# Patient Record
Sex: Female | Born: 1985 | Race: Black or African American | Hispanic: No | Marital: Single | Smoking: Never smoker
Health system: Southern US, Community
[De-identification: ages and names within clinical notes are randomized; demographics above are authoritative.]

## PROBLEM LIST (undated history)

## (undated) DIAGNOSIS — N920 Excessive and frequent menstruation with regular cycle: Secondary | ICD-10-CM

## (undated) DIAGNOSIS — T7840XA Allergy, unspecified, initial encounter: Secondary | ICD-10-CM

## (undated) DIAGNOSIS — D649 Anemia, unspecified: Secondary | ICD-10-CM

## (undated) DIAGNOSIS — D259 Leiomyoma of uterus, unspecified: Secondary | ICD-10-CM

## (undated) DIAGNOSIS — R0600 Dyspnea, unspecified: Secondary | ICD-10-CM

## (undated) HISTORY — DX: Allergy, unspecified, initial encounter: T78.40XA

## (undated) HISTORY — PX: TUBAL LIGATION: SHX77

---

## 2006-05-16 ENCOUNTER — Other Ambulatory Visit: Payer: Self-pay

## 2006-05-16 ENCOUNTER — Emergency Department: Payer: Self-pay | Admitting: Internal Medicine

## 2007-10-03 ENCOUNTER — Observation Stay: Payer: Self-pay

## 2010-07-06 ENCOUNTER — Ambulatory Visit: Payer: Self-pay | Admitting: Internal Medicine

## 2010-08-22 LAB — HM PAP SMEAR: HM Pap smear: NORMAL

## 2010-12-24 ENCOUNTER — Ambulatory Visit: Payer: Self-pay | Admitting: Internal Medicine

## 2011-03-17 ENCOUNTER — Emergency Department: Payer: Self-pay | Admitting: *Deleted

## 2011-04-25 LAB — HM HIV SCREENING LAB: HM HIV Screening: NEGATIVE

## 2011-05-27 HISTORY — PX: TUBAL LIGATION: SHX77

## 2011-08-18 ENCOUNTER — Observation Stay: Payer: Self-pay

## 2011-08-18 LAB — URINALYSIS, COMPLETE
Bilirubin,UR: NEGATIVE
Blood: NEGATIVE
Glucose,UR: NEGATIVE mg/dL (ref 0–75)
Hyaline Cast: 1
Leukocyte Esterase: NEGATIVE
Nitrite: NEGATIVE
Ph: 6 (ref 4.5–8.0)
Protein: 30
RBC,UR: 1 /HPF (ref 0–5)
Specific Gravity: 1.024 (ref 1.003–1.030)
WBC UR: 7 /HPF (ref 0–5)

## 2011-08-18 LAB — URIC ACID: Uric Acid: 3.3 mg/dL (ref 2.6–6.0)

## 2011-08-18 LAB — COMPREHENSIVE METABOLIC PANEL
Albumin: 2.5 g/dL — ABNORMAL LOW (ref 3.4–5.0)
Anion Gap: 12 (ref 7–16)
BUN: 7 mg/dL (ref 7–18)
Bilirubin,Total: 0.5 mg/dL (ref 0.2–1.0)
Chloride: 109 mmol/L — ABNORMAL HIGH (ref 98–107)
Co2: 18 mmol/L — ABNORMAL LOW (ref 21–32)
EGFR (Non-African Amer.): >60
Osmolality: 274 (ref 275–301)
Potassium: 4.7 mmol/L (ref 3.5–5.1)
SGOT(AST): 23 U/L (ref 15–37)
SGPT (ALT): 23 U/L
Sodium: 139 mmol/L (ref 136–145)
Total Protein: 6.7 g/dL (ref 6.4–8.2)

## 2011-08-18 LAB — CBC
HGB: 10.7 g/dL — ABNORMAL LOW (ref 12.0–16.0)
MCH: 30.3 pg (ref 26.0–34.0)
MCHC: 33.2 g/dL (ref 32.0–36.0)
Platelet: 189 10*3/uL (ref 150–440)
RBC: 3.54 10*6/uL — ABNORMAL LOW (ref 3.80–5.20)
WBC: 8.5 10*3/uL (ref 3.6–11.0)

## 2011-08-18 LAB — LACTATE DEHYDROGENASE: LDH: 462 U/L — ABNORMAL HIGH (ref 84–246)

## 2011-09-18 ENCOUNTER — Observation Stay: Payer: Self-pay | Admitting: Obstetrics and Gynecology

## 2011-09-19 ENCOUNTER — Observation Stay: Payer: Self-pay | Admitting: Obstetrics and Gynecology

## 2011-09-19 LAB — URINALYSIS, COMPLETE
Bilirubin,UR: NEGATIVE
Blood: NEGATIVE
Glucose,UR: NEGATIVE mg/dL (ref 0–75)
Nitrite: NEGATIVE
Ph: 7 (ref 4.5–8.0)
Protein: 30
RBC,UR: 1 /HPF (ref 0–5)
Specific Gravity: 1.026 (ref 1.003–1.030)
Squamous Epithelial: <1
WBC UR: 1 /HPF (ref 0–5)

## 2011-09-19 LAB — FETAL FIBRONECTIN: Appearance: NORMAL

## 2011-10-28 ENCOUNTER — Inpatient Hospital Stay: Payer: Self-pay

## 2011-10-28 LAB — CBC WITH DIFFERENTIAL/PLATELET
Basophil #: 0 10*3/uL (ref 0.0–0.1)
Basophil %: 0.4 %
HCT: 29.9 % — ABNORMAL LOW (ref 35.0–47.0)
HGB: 9.7 g/dL — ABNORMAL LOW (ref 12.0–16.0)
Lymphocyte #: 1.1 10*3/uL (ref 1.0–3.6)
MCH: 27 pg (ref 26.0–34.0)
MCHC: 32.5 g/dL (ref 32.0–36.0)
Monocyte #: 0.6 x10 3/mm (ref 0.2–0.9)
Neutrophil #: 5.5 10*3/uL (ref 1.4–6.5)
Neutrophil %: 75.2 %
Platelet: 276 10*3/uL (ref 150–440)
RDW: 16.2 % — ABNORMAL HIGH (ref 11.5–14.5)
WBC: 7.4 10*3/uL (ref 3.6–11.0)

## 2011-10-29 LAB — HEMATOCRIT: HCT: 29.6 % — ABNORMAL LOW (ref 35.0–47.0)

## 2011-10-31 ENCOUNTER — Emergency Department: Payer: Self-pay | Admitting: *Deleted

## 2011-11-04 LAB — PATHOLOGY REPORT

## 2012-04-06 IMAGING — US US OB < 14 WEEKS - US OB TV
1 series · 17 of 28 positions shown · non-contrast
Comparison: none

REASON FOR EXAM: pregancy, vaginal bleeding
COMMENTS:

[Series 1: us ob < 14 weeks - us ob tv · 17 of 110 slices shown]
[im 1/110]
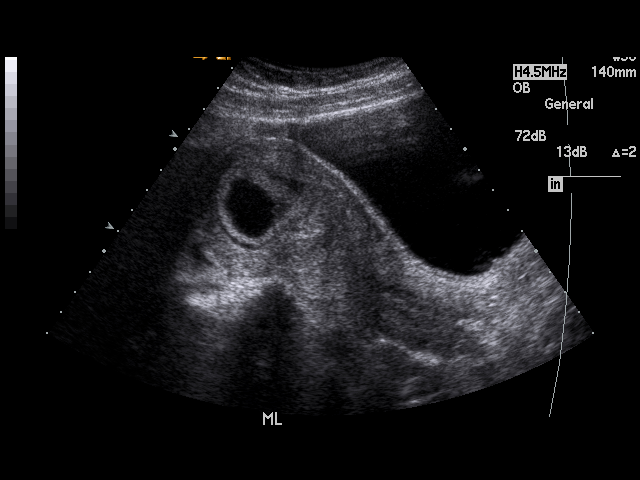
[im 9/110]
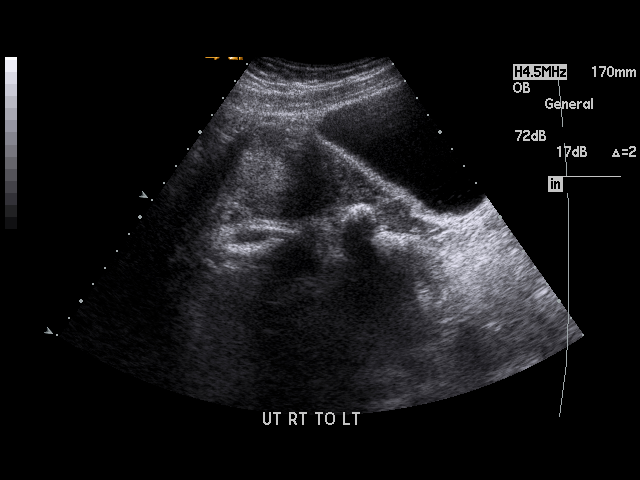
[im 17/110]
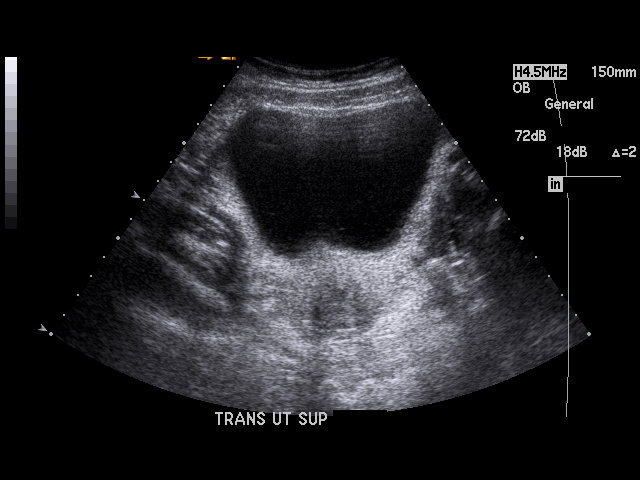
[im 21/110]
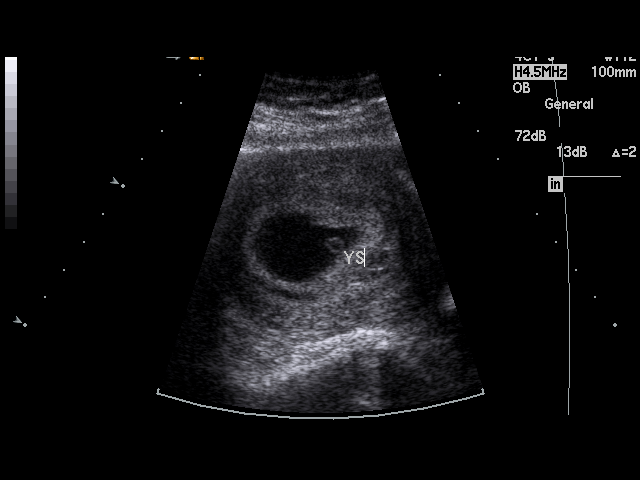
[im 29/110]
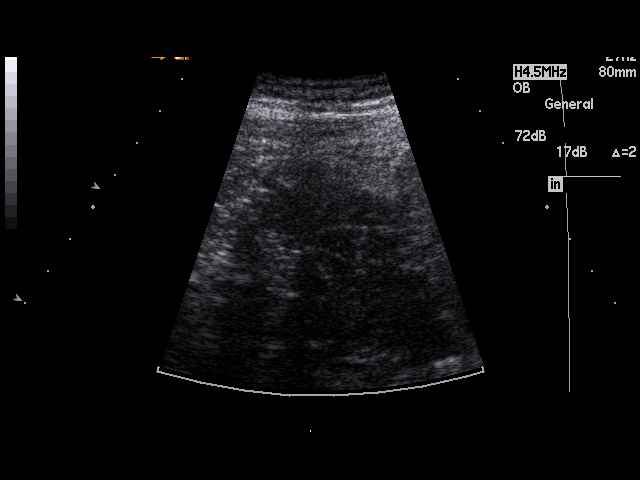
[im 37/110]
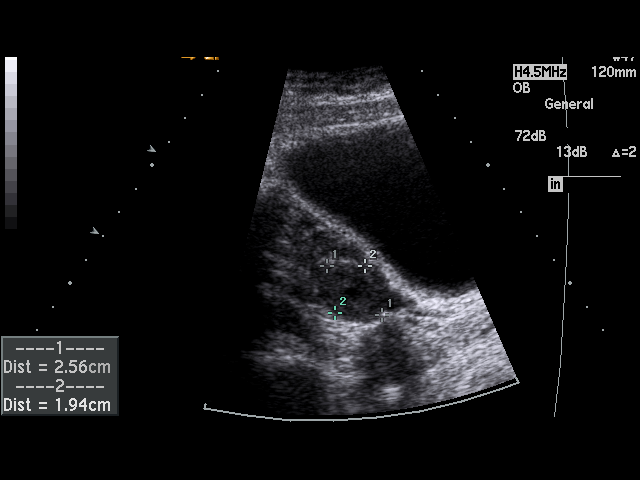
[im 41/110]
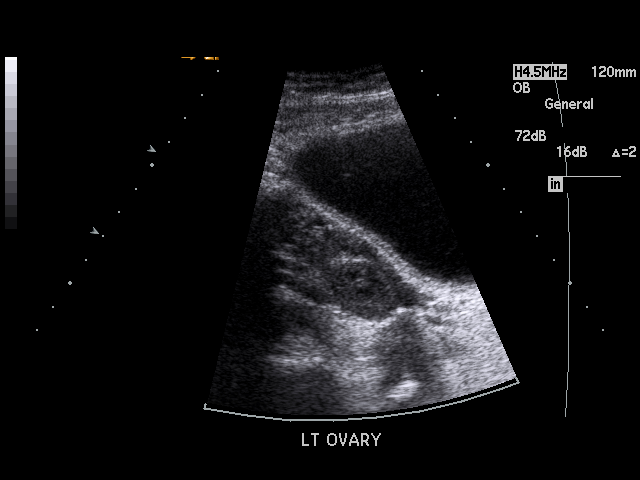
[im 49/110]
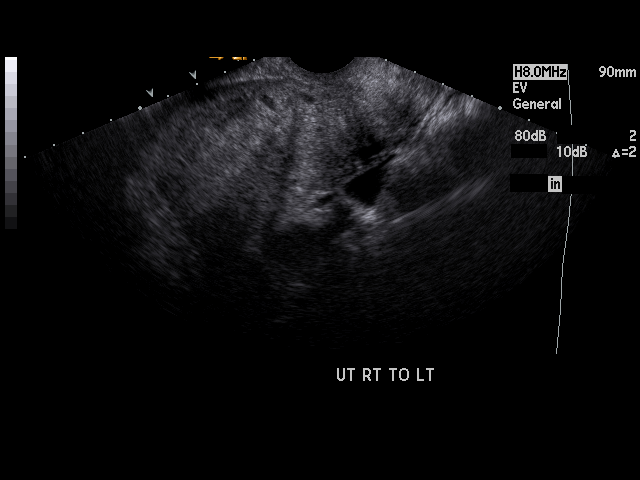
[im 57/110]
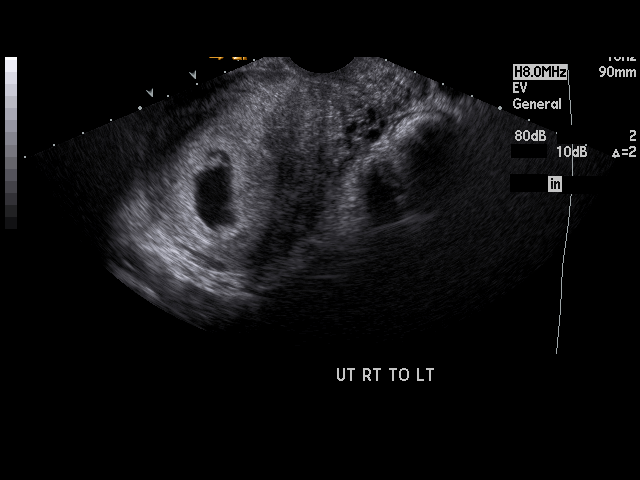
[im 61/110]
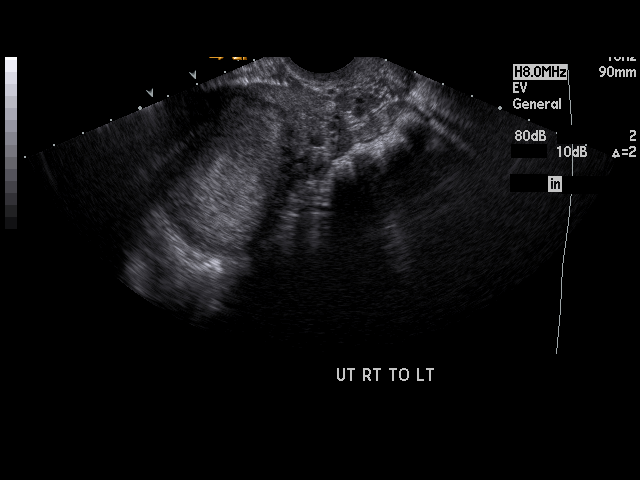
[im 69/110]
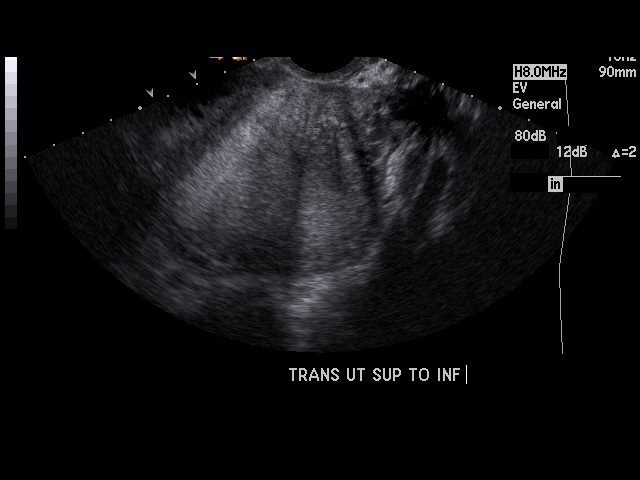
[im 73/110]
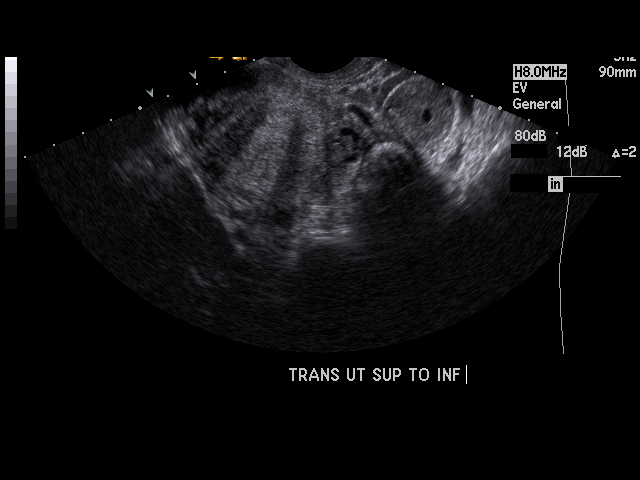
[im 81/110]
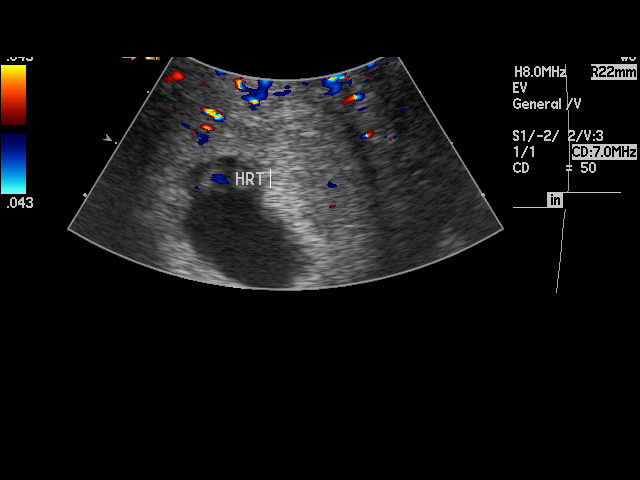
[im 89/110]
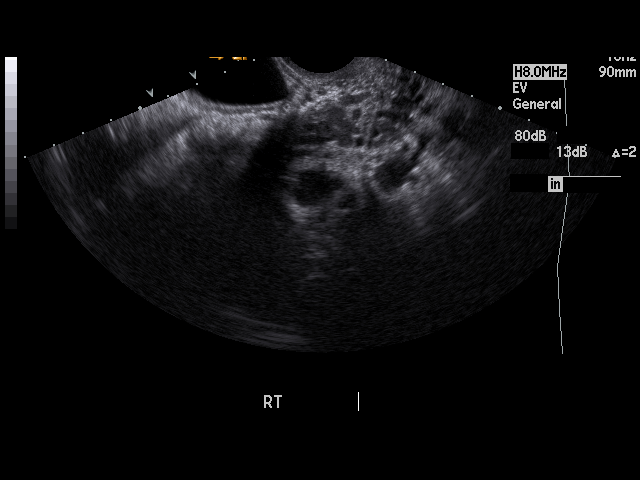
[im 93/110]
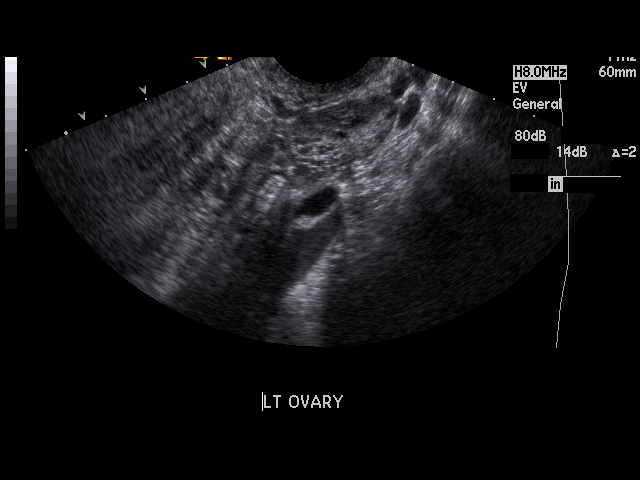
[im 101/110]
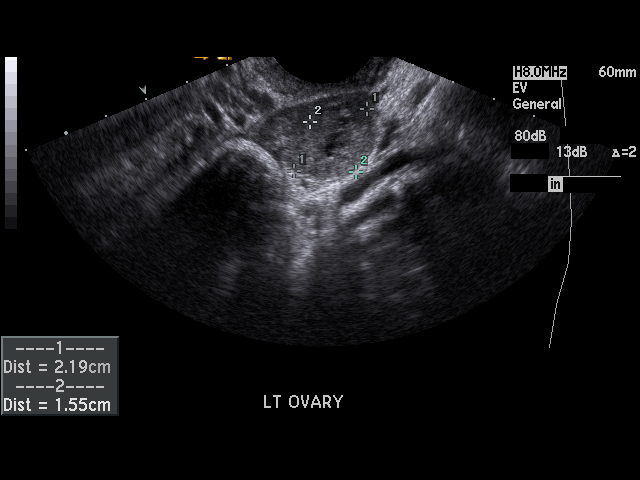
[im 110/110]
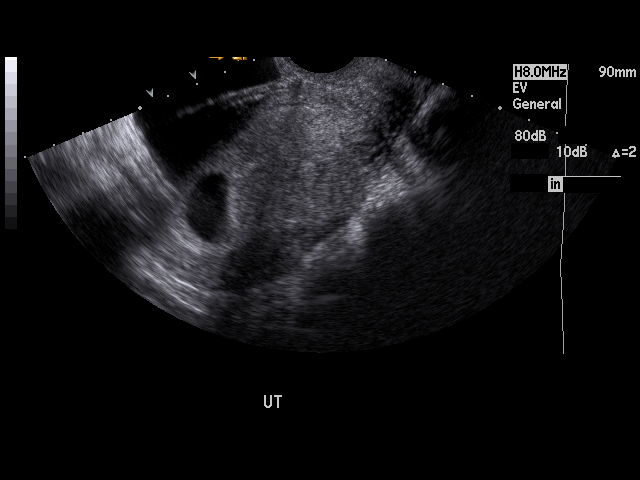

[17 of 28 positions shown; findings below may reference images not displayed]

PROCEDURE:     US  - US OB LESS THAN 14 WEEKS/W TRANS  - March 17, 2011 [DATE]

RESULT:     Pelvic sonogram utilizing an early OB protocol demonstrates an
intrauterine gestational sac with a crown-rump length of the fetal whole
measuring 0.59 cm consistent with a 6 week 3 day gestation which would
correlate with an ultrasound EDD of 11/07/2011. The uterus measures 13.04 x
5.94 x 7.21 cm. No uterine mass is evident. Yolk sac is present. There
appears to be a left corpus luteum cyst measuring 2.19 x 1.55 x 1.5 cm. The
ovaries are otherwise unremarkable. Fetal cardiac activity is demonstrated
and measured at 126 beats per minute. There is no abnormal fluid collection.

There is a subtle hypoechoic area near the uterine fundus measuring 1.76 x
0.54 x 0.5 cm adjacent to the gestational sac which could represent a small
subchorionic hemorrhage.
IMPRESSION: 6 week 3 day intrauterine gestation possibly with a small
subchorionic hemorrhage. Left corpus luteum cyst.

## 2012-05-24 LAB — CBC
HCT: 36 % (ref 35.0–47.0)
HGB: 11.4 g/dL — ABNORMAL LOW (ref 12.0–16.0)
MCH: 29.3 pg (ref 26.0–34.0)
MCHC: 31.8 g/dL — ABNORMAL LOW (ref 32.0–36.0)
Platelet: 286 10*3/uL (ref 150–440)
RDW: 12.9 % (ref 11.5–14.5)

## 2012-05-24 LAB — PREGNANCY, URINE: Pregnancy Test, Urine: NEGATIVE m[IU]/mL

## 2012-05-24 LAB — COMPREHENSIVE METABOLIC PANEL
Alkaline Phosphatase: 91 U/L (ref 50–136)
Calcium, Total: 8.8 mg/dL (ref 8.5–10.1)
Chloride: 107 mmol/L (ref 98–107)
Co2: 27 mmol/L (ref 21–32)
Creatinine: 0.93 mg/dL (ref 0.60–1.30)
EGFR (African American): >60
EGFR (Non-African Amer.): >60
Glucose: 126 mg/dL — ABNORMAL HIGH (ref 65–99)
Osmolality: 283 (ref 275–301)
SGOT(AST): 17 U/L (ref 15–37)
SGPT (ALT): 18 U/L (ref 12–78)
Sodium: 141 mmol/L (ref 136–145)

## 2012-05-24 LAB — URINALYSIS, COMPLETE
Ketone: NEGATIVE
Nitrite: NEGATIVE
Ph: 7 (ref 4.5–8.0)
Protein: NEGATIVE
RBC,UR: 1 /HPF (ref 0–5)
Squamous Epithelial: 2
WBC UR: 1 /HPF (ref 0–5)

## 2012-05-25 ENCOUNTER — Inpatient Hospital Stay: Payer: Self-pay | Admitting: Surgery

## 2012-05-26 DIAGNOSIS — I472 Ventricular tachycardia: Secondary | ICD-10-CM

## 2012-05-26 HISTORY — PX: CHOLECYSTECTOMY: SHX55

## 2012-05-26 LAB — CBC WITH DIFFERENTIAL/PLATELET
Basophil %: 0.3 %
Eosinophil #: 0 10*3/uL (ref 0.0–0.7)
Eosinophil %: 0.1 %
HCT: 30.9 % — ABNORMAL LOW (ref 35.0–47.0)
MCHC: 33.6 g/dL (ref 32.0–36.0)
MCV: 92 fL (ref 80–100)
Monocyte #: 0.6 x10 3/mm (ref 0.2–0.9)
Neutrophil #: 5.7 10*3/uL (ref 1.4–6.5)
RBC: 3.36 10*6/uL — ABNORMAL LOW (ref 3.80–5.20)

## 2012-05-27 LAB — CBC WITH DIFFERENTIAL/PLATELET
Basophil #: 0 10*3/uL (ref 0.0–0.1)
Basophil %: 0.5 %
Eosinophil %: 1.5 %
HCT: 25.2 % — ABNORMAL LOW (ref 35.0–47.0)
HGB: 8.6 g/dL — ABNORMAL LOW (ref 12.0–16.0)
Lymphocyte #: 1.6 10*3/uL (ref 1.0–3.6)
MCH: 31 pg (ref 26.0–34.0)
MCHC: 34.1 g/dL (ref 32.0–36.0)
MCV: 91 fL (ref 80–100)
Monocyte #: 0.6 x10 3/mm (ref 0.2–0.9)
Neutrophil #: 3 10*3/uL (ref 1.4–6.5)
Neutrophil %: 56.2 %
RDW: 13.1 % (ref 11.5–14.5)
WBC: 5.3 10*3/uL (ref 3.6–11.0)

## 2012-05-28 LAB — PATHOLOGY REPORT

## 2012-08-28 ENCOUNTER — Ambulatory Visit: Payer: Self-pay | Admitting: Family Medicine

## 2012-08-28 LAB — URINALYSIS, COMPLETE
Glucose,UR: NEGATIVE mg/dL (ref 0–75)
Ph: 6.5 (ref 4.5–8.0)
Protein: NEGATIVE

## 2013-02-24 ENCOUNTER — Ambulatory Visit: Payer: Self-pay | Admitting: Internal Medicine

## 2013-02-24 LAB — RAPID INFLUENZA A&B ANTIGENS

## 2013-06-15 IMAGING — US ABDOMEN ULTRASOUND LIMITED
1 series · 14 of 25 positions shown · non-contrast
Comparison: none

REASON FOR EXAM: ruq pain
COMMENTS:   Body Site: GB and Fossa, CBD, Head of Pancreas

[Series 1: abdomen ultrasound limited · 0.25mm/px · 14 of 31 slices shown]
[im 1/31]
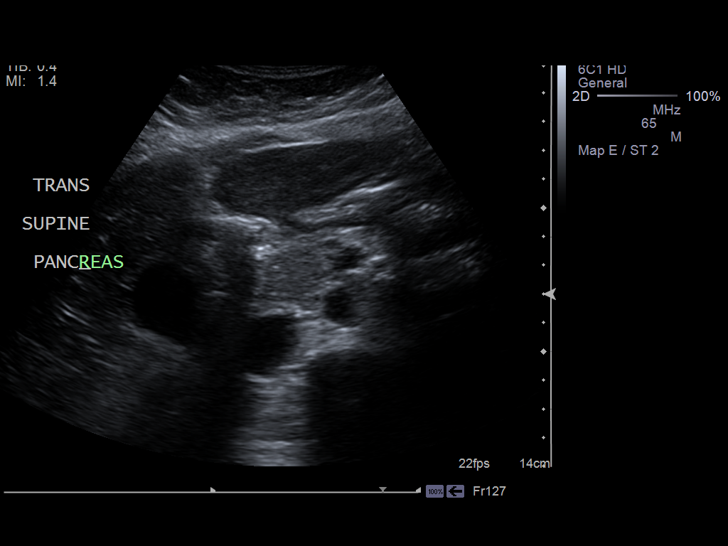
[im 3/31]
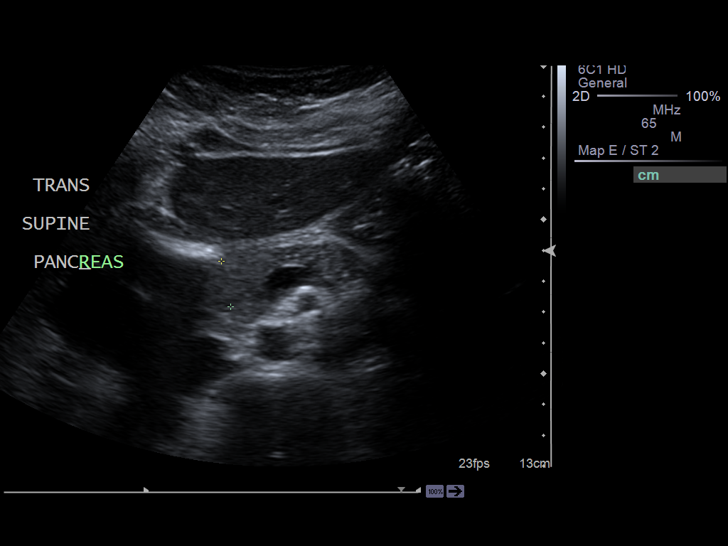
[im 6/31]
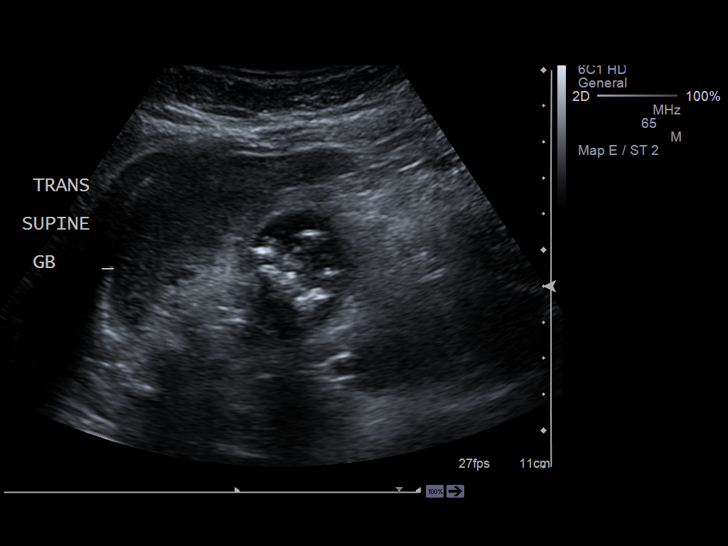
[im 8/31]
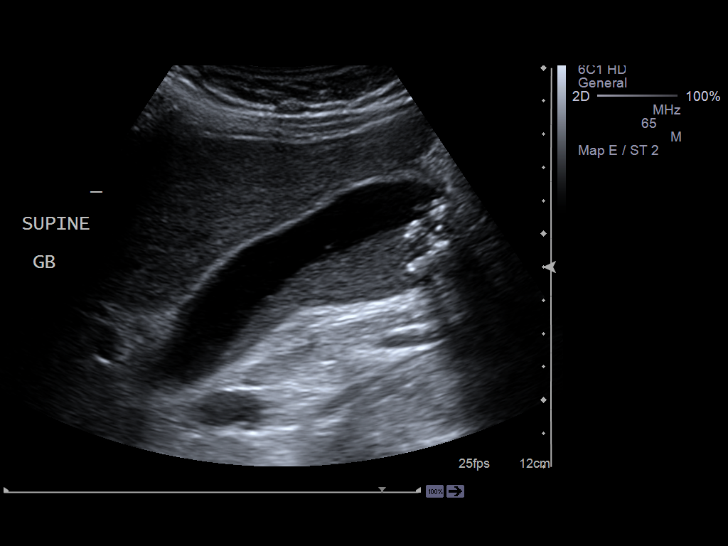
[im 11/31]
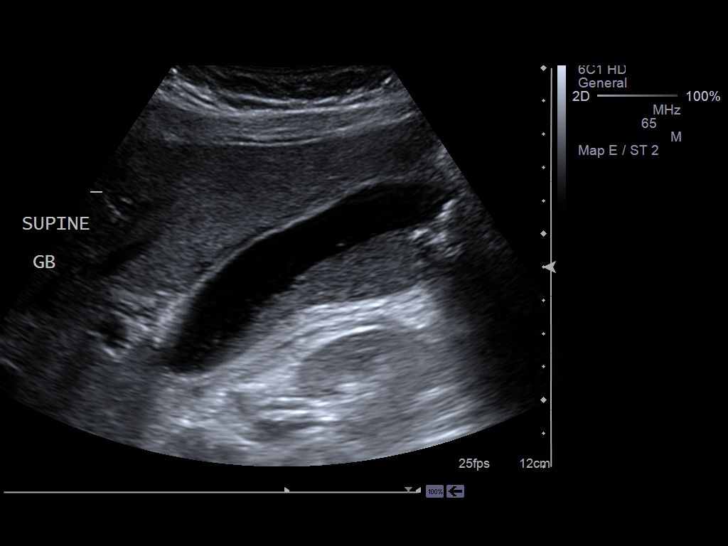
[im 12/31]
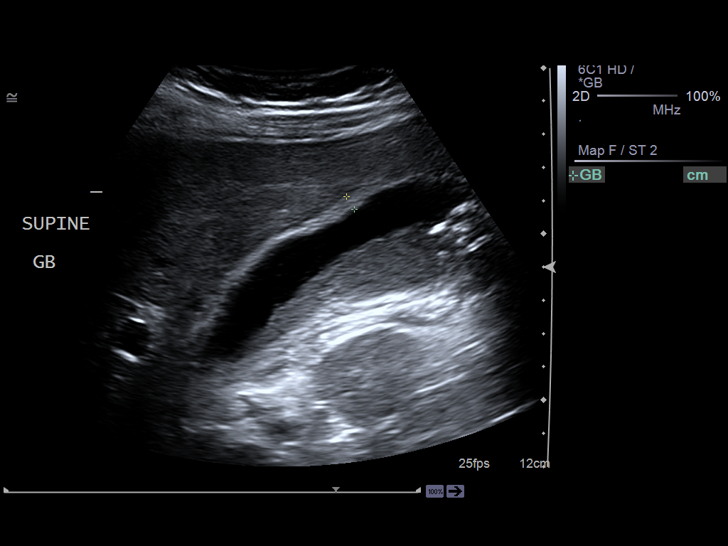
[im 14/31]
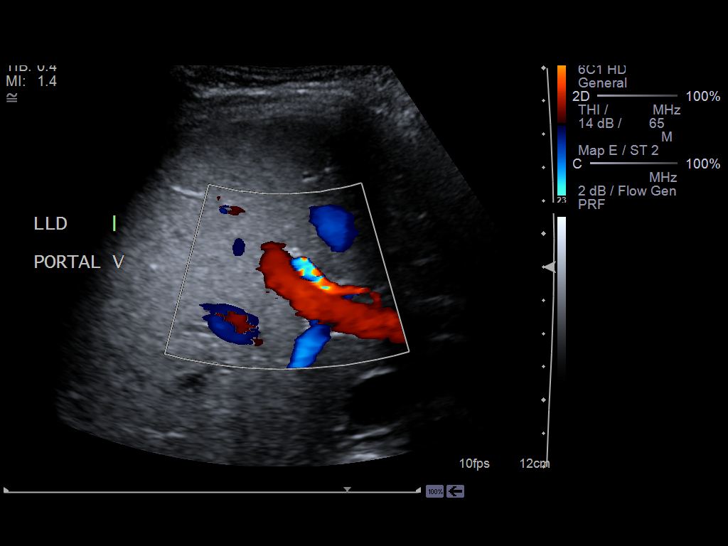
[im 17/31]
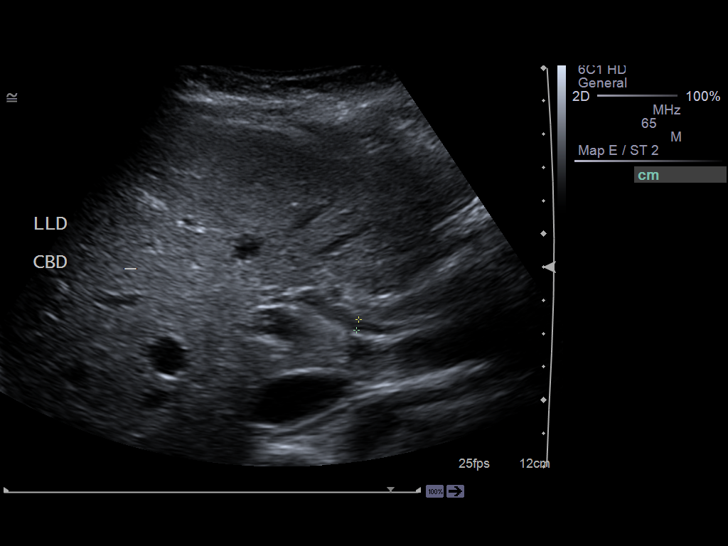
[im 19/31]
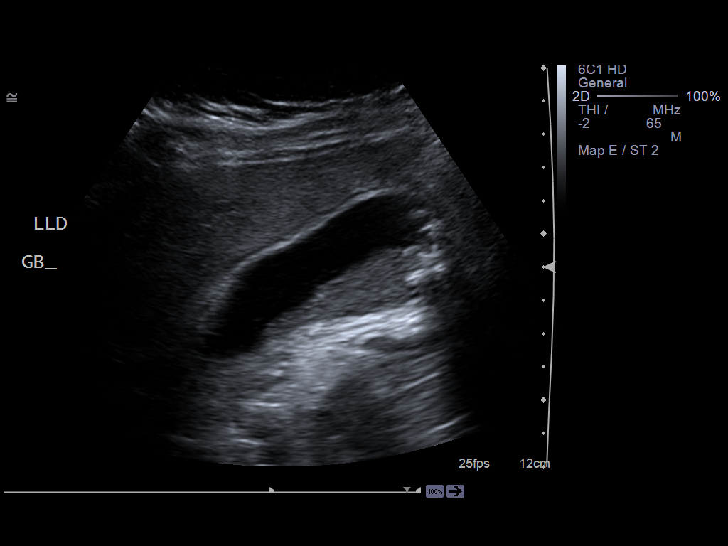
[im 21/31]
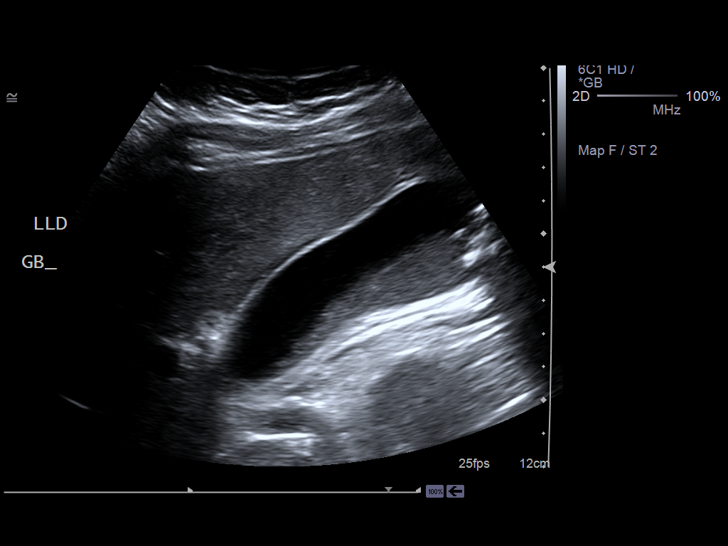
[im 23/31]
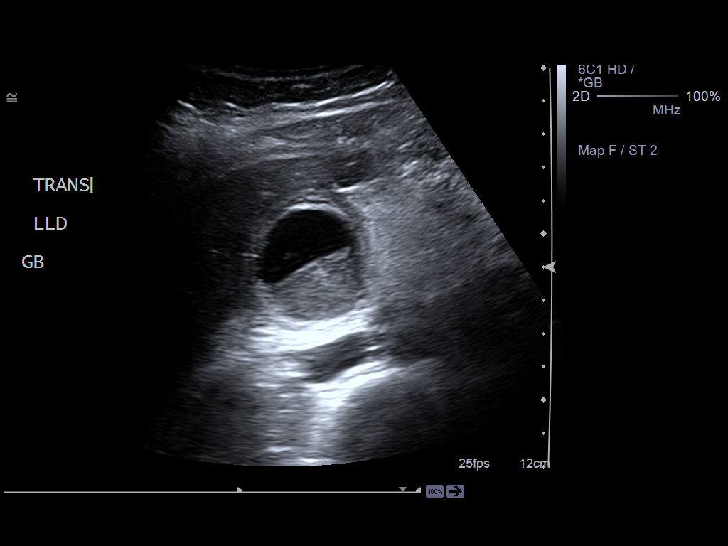
[im 26/31]
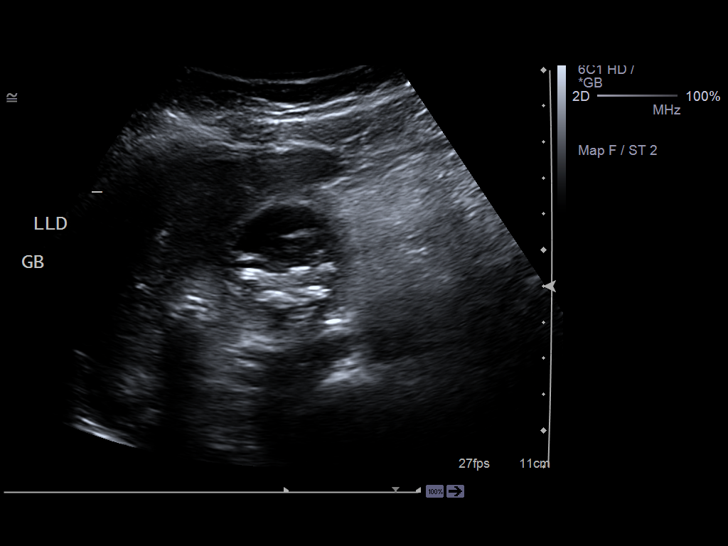
[im 28/31]
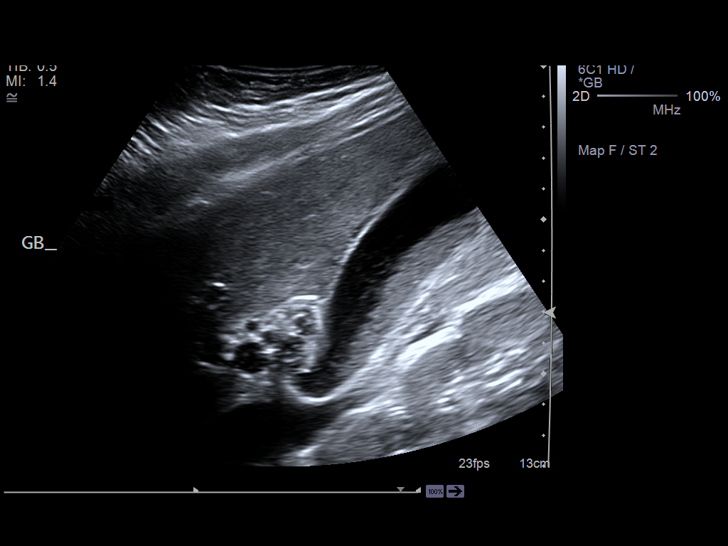
[im 31/31]
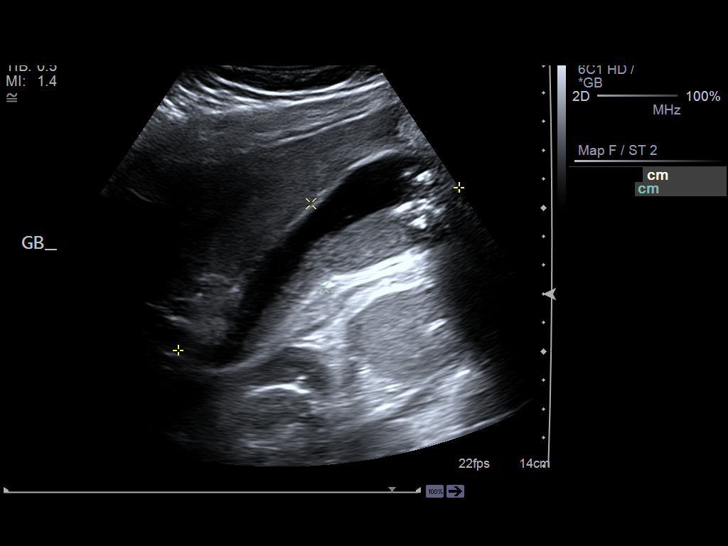

[14 of 25 positions shown; findings below may reference images not displayed]

PROCEDURE:     US  - US ABDOMEN LIMITED SURVEY  - May 25, 2012  [DATE]

RESULT:     Emergent limited abdominal right upper quadrant sonogram is
performed. The visualized pancreas is unremarkable. There are changes of
sludge and echogenic shadowing stones in the gallbladder. Gallbladder wall
measures up to 4.5 mm. A positive sonographic Murphy's sign is reported.
Portal venous flow appears normal. Common bile duct diameter is 3.3 to
mm.
IMPRESSION: Findings of cholelithiasis with acute cholecystitis. No
common bile duct or intrahepatic biliary ductal dilatation demonstrated.
Surgical consultation is recommended.

[REDACTED](*)

## 2014-06-13 ENCOUNTER — Ambulatory Visit: Payer: 59 | Admitting: Nurse Practitioner

## 2014-07-11 ENCOUNTER — Ambulatory Visit: Payer: 59 | Admitting: Nurse Practitioner

## 2014-09-12 NOTE — H&P (Signed)
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   Review of Systems:   Fever/Chills No    Cough No    Abdominal Pain Yes    Diarrhea No    Constipation No    Nausea/Vomiting Yes    SOB/DOE No    Chest Pain No    Dysuria No    Tolerating Diet No  Nauseated  Vomiting   Physical Exam:   GEN disheveled, uncomfortable    HEENT pink conjunctivae    NECK supple    RESP normal resp effort  clear BS  no use of accessory muscles    CARD regular rate    ABD positive tenderness  soft  pos Murphy's    LYMPH negative neck    EXTR negative edema    SKIN normal to palpation    PSYCH alert, A+O to time, place, person, good insight   Lab Results: Hepatic:  30-Dec-13 21:56    Bilirubin, Total 0.4   Alkaline Phosphatase 91   SGPT (ALT) 18   SGOT (AST) 17   Total Protein, Serum 8.1   Albumin, Serum 3.7  Routine Chem:  30-Dec-13 21:56    Glucose, Serum  126   BUN 12   Creatinine (comp) 0.93   Sodium, Serum 141   Potassium, Serum  3.2   Chloride, Serum 107   CO2, Serum 27   Calcium (Total), Serum 8.8   Osmolality (calc) 283   eGFR (African American) >60   eGFR (Non-African American) >60 (eGFR values <39m/min/1.73 m2 may be an indication of chronic kidney disease (CKD). Calculated eGFR is useful in patients with stable renal function. The eGFR calculation will not be reliable in acutely ill patients when serum creatinine is changing rapidly. It is not useful in  patients on dialysis. The eGFR calculation may not be  applicable to patients at the low and high extremes of body sizes, pregnant women, and vegetarians.)   Anion Gap 7   Lipase 120 (Result(s) reported on 24 May 2012 at 11:28PM.)  Routine UA:  30-Dec-13 21:56    Color (UA) Yellow   Clarity (UA) Hazy   Glucose (UA) Negative   Bilirubin (UA) Negative   Ketones (UA) Negative   Specific Gravity (UA) 1.027   Blood (UA) Negative   pH (UA) 7.0   Protein (UA) Negative   Nitrite (UA) Negative   Leukocyte Esterase (UA) Trace (Result(s) reported on 24 May 2012 at 10:13PM.)   RBC (UA) 1 /HPF   WBC (UA) 1 /HPF   Bacteria (UA) NONE SEEN   Epithelial Cells (UA) 2 /HPF   Mucous (UA) PRESENT (Result(s) reported on 24 May 2012 at 10:13PM.)  Routine Sero:  30-Dec-13 21:56    Pregnancy Test, Urine NEGATIVE (The results of the qualitative urine HCG (Pregnancy Test) should be evaluated in light of other clinical information.  There are limitations to the test which, in certain clinical situations, may result in a false positive or negative result. Thehigh dose hook effect can  occur in urine samples with extremely high HCG concentrations.  This effect can produce a negative result in certain situations. It is suggested that results of the qualitative HCG be confirmed by an alternate methodology, such as the quantitative serum beta HCG test.)  Routine Hem:  30-Dec-13 21:56    WBC (CBC) 8.9   RBC (CBC) 3.90   Hemoglobin (CBC)  11.4   Hematocrit (CBC) 36.0   Platelet Count (CBC) 286 (Result(s) reported on 24 May 2012 at 10:18PM.)   MCV 92   MCH 29.3   MCHC  31.8   RDW 12.9     Assessment/Admission Diagnosis acute chole admit, hydrate control; pain and nausea lap chole risks and options rev'd Dr Marina Gravel to perform surgery, pt informed ansd consented   Electronic Signatures: Florene Glen (MD)  (Signed 31-Dec-13 02:24)  Authored: CHIEF COMPLAINT and HISTORY, PAST MEDICAL/SURGIAL HISTORY, ALLERGIES, FAMILY AND SOCIAL HISTORY, REVIEW OF  SYSTEMS, PHYSICAL EXAM, LABS, ASSESSMENT AND PLAN   Last Updated: 31-Dec-13 02:24 by Florene Glen (MD)

## 2014-09-15 NOTE — Discharge Summary (Signed)
PATIENT NAME:  Karen Walton, Karen Walton MR#:  [id number] DATE OF BIRTH:  06-Oct-1985  DATE OF ADMISSION:  05/25/2012 DATE OF DISCHARGE:  05/27/2012  FINAL DIAGNOSES:  Acute calculus cholecystitis.   PROCEDURES PERFORMED: Laparoscopic cholecystectomy, right upper quadrant ultrasound, intravenous antibiotics.   HOSPITAL COURSE AND SUMMARY:  The patient was admitted with abdominal pain and a clinical scenario most consistent with acute cholecystitis.  She had findings at the time of surgery of patchy gangrenous changes.  A Jackson-Pratt drain was left in place.  On postoperative day #1, the patient had non-bilious output from her Jackson-Pratt, adequate pain control, but a low-grade fever and was continued on antibiotics for one more day.  On postoperative day #2, the patient demonstrated marked improvement with no further fever.  Jackson-Pratt drain was removed.  Follow up with my office in one weeks' time.   DISCHARGE MEDICATIONS:  Will include Norco 5/325 1 to 2 tabs by mouth every 4 to 6 hours as needed pain.   DISCHARGE INSTRUCTIONS:  Call the office with any questions or concerns.     ____________________________ Redge GainerMark A. Egbert GaribaldiBird, MD Verta Ellenmab:ea D: 06/09/2012 22:57:12 ET T: 06/09/2012 23:29:13 ET JOB#: [id number]  cc: Loraine LericheMark A. Egbert GaribaldiBird, MD, <Dictator> Leo GrosserNancy J. Maloney, MD Sylvio Weatherall Kela MillinA Clarinda Obi MD ELECTRONICALLY SIGNED 06/10/2012 20:46

## 2014-09-15 NOTE — Op Note (Signed)
PATIENT NAME:  Karen Walton, Karen Walton MR#:  [id number] DATE OF BIRTH:  1985-11-22  DATE OF PROCEDURE:  05/25/2012  PREOPERATIVE DIAGNOSIS: Acute cholecystitis.  POSTOPERATIVE DIAGNOSIS: Acute cholecystitis with patchy gangrenous changes.   PROCEDURE PERFORMED: Laparoscopic cholecystectomy.   ATTENDING SURGEON: Honora Searson A. Egbert GaribaldiBird, M.D.   ASSISTANT: Surgical scrub technologist.   TYPE OF ANESTHESIA: General oral endotracheal.   INDICATION: A 29 year old female recently postpartum with several day history of unrelenting epigastric right upper quadrant abdominal pain. Work-up is consistent with acute acalculous cholecystitis. Bile duct was normal on ultrasonography. Preoperative liver function tests were normal. I discussed with the patient and her family cholecystectomy as definitive therapy. She understands the risks of bleeding, infection, bile duct injury and need for conversion to open operation.   FINDINGS: Acute acalculous cholecystitis with patchy areas of gangrene.   ESTIMATED BLOOD LOSS: 50 mL.   DRAINS: A 19 French Blake drain in gallbladder fossa.   SPECIMENS: Gallbladder with contents.   DESCRIPTION OF PROCEDURE:    Preparation: With the patient in the supine position, general endotracheal anesthesia was induced. The left arm was padded and tucked at her side. The patient's abdomen was widely prepped and draped with ChloraPrep solution and timeout was observed.    Incision: An infraumbilical, transversely oriented skin incision was fashioned with a scalpel and carried down through a previous scar through subcutaneous tissue with sharp dissection to the abdominal midline fascia which was incised in the midline and elevated with Kocher clamps. The peritoneum was entered sharply between hemostats and Metzenbaum scissors. An 0-Vicryl U-stitch was passed on either side of the abdominal midline fascia securing a 12 mm Hassan trocar placed under direct visualization. Pneumoperitoneum was  established. The patient was then positioned in reverse Trendelenburg and airplane right side up. Under direct visualization, a 5 mm bladeless trocar was placed in the epigastric region. Two 5 mm trocars were then placed in the right lateral abdomen. The gallbladder was covered in an inflammatory rind of omentum. This was taken down with gentle technique and point electrocautery at bleeding points.   The gallbladder was aspirated of approximately 60 mL of motor oil appearing bile. The gallbladder wall was markedly thickened. It was grasped along its fundus and elevated towards the right shoulder. Gentle dissection demonstrated adhesions of the stomach to the body of gallbladder which were inflammatory in nature and taken down with a combination of blunt technique and sharp dissection. Hartmann's pouch was able to be identified. Lateral traction was placed on Hartmann's pouch. The hepatoduodenal ligament was then carefully dissected out. A cystic artery that appeared to be anterior on the gallbladder was critically identified and doubly clipped on the portal side, singly clipped on the gallbladder side and divided. Further dissection posterior to this demonstrated a cystic duct and gallbladder junction. Critical view of safety was also insured. Hook electrocautery was used to incise the peritoneum on either side of the gallbladder. With a critical view of safety cholecystectomy, three clips were placed on the portal side of the cystic duct, one on the gallbladder side and the structure was then divided. The gallbladder was then retrieved off the gallbladder fossa utilizing hook cautery apparatus and placed into an Endo Catch device. A 5 mm operating camera was used in the epigastric region. We looked down to the umbilical region and found no evidence of bowel injury. The Endo Catch bag and then retrieved through the umbilical port site by incising the fascia just slightly. With pneumoperitoneum  reestablished, the  right  upper quadrant was then irrigated with a total of 1 liter of normal saline during the operation, aspirated dry and point hemostasis was obtained in the gallbladder fossa with electrocautery. A 19 mm Blake drain was directed into the space and exited the lowermost right upper quadrant port site. Drain site was secured with 4-0 nylon suture.   Ports were then removed under direct visualization. The infraumbilical fascial defect was reapproximated utilizing a total of three interrupted 0 Vicryl sutures in vertical orientation. A total of 30 mL of 0.25% plain Marcaine was infiltrated during the operation into the skin and fascial incisions. 4-0 Vicryl subcuticular was applied to all skin edges followed by the application of benzoin, Steri-Strips, Telfa and Tegaderm. The patient was then subsequently extubated and taken to the recovery room in stable and satisfactory condition by anesthesia services.  ____________________________ Redge Gainer Egbert Garibaldi, MD mab:sb D: 05/26/2012 08:51:00 ET T: 05/26/2012 09:08:02 ET JOB#: [id number]  cc: Loraine Leriche A. Egbert Garibaldi, MD, <Dictator> Raynald Kemp MD ELECTRONICALLY SIGNED 05/28/2012 18:34

## 2014-09-17 NOTE — Op Note (Signed)
PATIENT NAME:  Karen Walton, [name] MR#:  [id number] DATE OF BIRTH:  12/18/1985  DATE OF PROCEDURE:  10/30/2011  PREOPERATIVE DIAGNOSIS: Multiparous female desiring permanent sterilization.   POSTOPERATIVE DIAGNOSIS: Multiparous female desiring permanent sterilization.  PROCEDURE:  Postpartum bilateral tubal ligation.   SURGEON: Adria Devonarrie Amea Mcphail, MD   ESTIMATED BLOOD LOSS: Approximately 25 mL.   FINDINGS: Tubal ostia telescoping bilaterally with normal fundus of uterus.   DESCRIPTION OF PROCEDURE:  The patient was taken to the Operating Room and placed in supine position. After adequate general endotracheal anesthesia was instilled, the patient was prepped and draped in the usual sterile fashion. Timeout was performed. The umbilicus was grasped and incision was made within the umbilical folds and carried sharply down to the fascia. The fascia was nicked in the midline with the Metzenbaum scissors and the s  retractors were placed into the abdomen. The patient was placed in Trendelenburg, and first the left tube were palpated with the surgeon's finger, grasped with the Babcock's and carried to the outside of the abdomen. The Bovie was used to cut a small hole into the broad ligament, and two pieces of plain gut suture were tied approximately 2 cm apart on apposing ends of the tube. The Metzenbaum scissors were then used to cut out and excise a portion of the tube. Good telescoping tubes were seen. The tube was allowed to fall back into the abdomen. Attention was then turned to the right tube which was grasped with a Babcock clamp, carried to the outside of the abdomen, and carried out to the fimbriated end to identify the tube. The tube broad ligament was then opened with the Bovie, and two pieces of plain gut suture were placed through the incision. These were then tied off approximately 2 cm away from each other. The tube was excised with the Metzenbaum scissors, and the tubal telescoping ends were seen.  Good hemostasis was identified. The tube was allowed to fall back into the abdomen. The second tube was identified again. Good hemostasis was identified there. There fascia was grasped with Adson's and the UR6 was used to close the fascia. Then 4-0 Monocryl was used to approximate the skin edges. Dermabond was placed. A bandage was placed.    The patient was taken to recovery after having tolerated the procedure well.    ____________________________ Elliot Gurneyarrie C. Burnell Matlin, MD cck:cbb D: 11/02/2011 11:01:52 ET T: 11/03/2011 10:35:07 ET JOB#: [id number]  cc: Elliot Gurneyarrie C. Keianna Signer, MD, <Dictator> Elliot GurneyARRIE C Christos Mixson MD ELECTRONICALLY SIGNED 11/04/2011 7:32

## 2014-10-03 NOTE — H&P (Signed)
L&D Evaluation:  History:   HPI 29 yo G3P1011 at 2622w4d gestational age by LMP and 13 week ultrasound, presents with contractions after having been seen in clinic today.  She was sent over with her cervix dilated to 1cm.  A fetal fibronectin was collected and sent with her.  She denies urinary and vaginal complaints. She was treated for candida vulvovaginitis 4 days ago.  She notes positive fetal movement, no leakage of fluid, no vaginal bleeding.    Presents with contractions    Patient's Medical History No Chronic Illness    Patient's Surgical History none    Medications Pre Natal Vitamins    Allergies NKDA    Social History none    Family History both maternal and paternal aunts are deaf   ROS:   ROS normal unless noted in HPI    General normal    HEENT normal    GI normal    Resp normal    CV normal   Exam:   Vital Signs stable    Urine Protein not completed    General no apparent distress    Mental Status clear    Chest clear    Heart normal sinus rhythm    Abdomen gravid, non-tender    Back no CVAT    Edema no edema    Pelvic 1cm per RN (no change over 4 hours)    Mebranes Intact    FHT normal rate with no decels    FHT Description 135/mod var/+accels/one periodic variable decel with quick return to baselin    Ucx irregular, occasional    Skin no lesions    Other fFN negative   Impression:   Impression reactive NST, preterm contractions   Plan:   Plan UA, EFM/NST, monitor contractions and for cervical change, discharge    Comments She has no symptoms, but will send UA, as she has a history of UTI 3 months ago.  Will not hold up her discharge.  Her follow up is in about 10 days. precautions given for her to return should her contractions return and strengthen    Follow Up Appointment already scheduled. 09/29/11   Electronic Signatures: Conard NovakJackson, Jaxsin Bottomley D (MD)  (Signed 26-Apr-13 16:52)  Authored: L&D Evaluation   Last Updated:  26-Apr-13 16:52 by Conard NovakJackson, Shaylah Mcghie D (MD)

## 2014-10-03 NOTE — H&P (Signed)
L&D Evaluation:  History:   HPI 29 year old G3P0111 presents to L&D at 3639 1/7 weeks with c/o sharp abd pains and cramping. EDD 11/03/11, [facility] at 30 weeks. Pregnancy with no significant concerns/problems. First pregnancy induced at 36 weeks due to preeclampsia.  Labs: A Pos, RI, varicella titers not done, all other labs WNL except noted to have Chlamydia at 36 weeks and pt and partner treated. GBS Positive Pt desires PP BTL, 30 day Medicaid [facility], no copy of those papers, will try to obtain today.    Presents with contractions    Patient's Medical History No Chronic Illness  hx preeclampsia first pregnancy    Patient's Surgical History none    Medications Pre Natal Vitamins    Allergies NKDA    Social History none    Family History Non-Contributory   ROS:   ROS All systems were reviewed.  HEENT, CNS, GI, GU, Respiratory, CV, Renal and Musculoskeletal systems were found to be normal.   Exam:   Vital Signs stable    Urine Protein not completed    General no apparent distress, breathing through ctx's    Mental Status clear    Abdomen gravid, non-tender    Estimated Fetal Weight Average for gestational age    Back no CVAT    Edema no edema    Pelvic no external lesions, 3 cm and then 4 cm on recheck    Mebranes Intact    FHT normal rate with no decels    Ucx irregular    Skin dry   Impression:   Impression early labor   Plan:   Plan EFM/NST, monitor contractions and for cervical change, antibiotics for GBBS prophylaxis, will admit for labor, and start abx    Comments pt breathing through ctx's but still very spaced out, denies need for pain meds at this time, will go ahead and admit and get antibiotics started for GBS prophylaxis   Electronic Signatures: Shella Maximutnam, Jaicey Sweaney (CNM)  (Signed 04-Jun-13 06:29)  Authored: L&D Evaluation   Last Updated: 04-Jun-13 06:29 by Shella MaximPutnam, Alycen Mack  (CNM)

## 2014-10-03 NOTE — H&P (Signed)
L&D Evaluation:  History:   HPI 29 year old G3P1 presents to L&D from ER per EMS with c/o nausea, vomiting and syncopal episode at work today. EDD 11/03/11, 28 6/7 weeks today. [facility], no problems so far during pregnancy other than UTI and +BV. Hx of preeclampsia during last pregnancy and delivery at 37 weeks, BP elevated one time during this pregnancy, baseline 24 hour urine done (per pt but hasn't heard results).  Came to ER per EMS this morning due to "passing out" at work. Also states she woke up with nausea and vomiting this AM.    Presents with nausea/vomiting, syncopal episode    Patient's Medical History No Chronic Illness  abnormal Pap in past    Patient's Surgical History none    Medications Pre Natal Vitamins    Allergies NKDA    Social History none    Family History Non-Contributory   ROS:   ROS All systems were reviewed.  HEENT, CNS, GI, GU, Respiratory, CV, Renal and Musculoskeletal systems were found to be normal.   Exam:   Vital Signs stable    Urine Protein trace    General no apparent distress    Mental Status clear    Chest clear    Abdomen gravid, non-tender    Estimated Fetal Weight Average for gestational age    Back no CVAT    Edema no edema    Pelvic no pelvic exam done    Mebranes Intact    FHT normal rate with no decels    Ucx absent    Skin dry   Impression:   Impression nausea, vomiting, GI virus? IUP at 28 weeks   Plan:   Plan EFM/NST, fluids, discharge, labs done per ER essentially WNL, IV hydration, Zofran IV, bland diet    Comments Pt states after having IV fluids and Zofran she feels better now. Instructed to eat bland diet for today, push fluids, and use Zofran as needed (pt already has). [facility] (getting 1 hour Glucola that day) then pt states she [facility].    Follow Up Appointment already scheduled   Electronic  Signatures: Shella Maximutnam, Warrick Llera (CNM)  (Signed 25-Mar-13 14:46)  Authored: L&D Evaluation   Last Updated: 25-Mar-13 14:46 by Shella MaximPutnam, Kaegan Stigler (CNM)

## 2015-01-31 ENCOUNTER — Encounter: Payer: Self-pay | Admitting: Podiatry

## 2015-01-31 ENCOUNTER — Ambulatory Visit (INDEPENDENT_AMBULATORY_CARE_PROVIDER_SITE_OTHER): Payer: 59

## 2015-01-31 ENCOUNTER — Ambulatory Visit (INDEPENDENT_AMBULATORY_CARE_PROVIDER_SITE_OTHER): Payer: 59 | Admitting: Podiatry

## 2015-01-31 VITALS — BP 122/68 | HR 90 | Resp 18

## 2015-01-31 DIAGNOSIS — Q6650 Congenital pes planus, unspecified foot: Secondary | ICD-10-CM | POA: Diagnosis not present

## 2015-01-31 DIAGNOSIS — M2040 Other hammer toe(s) (acquired), unspecified foot: Secondary | ICD-10-CM

## 2015-01-31 DIAGNOSIS — M722 Plantar fascial fibromatosis: Secondary | ICD-10-CM

## 2015-01-31 NOTE — Progress Notes (Signed)
   Subjective:    Patient ID: Karen Walton, female    DOB: February 14, 1986, 29 y.o.   MRN: [id number]  HPI I HAVE SOME CORNS THAT ARE PAINFUL AND HAVE BEEN FOR ABOUT A YEAR AND THE LEFT IS WORSE AND THROBS AND BURNS AND SORE AND TENDER AND HURTS WITH SHOES . 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    Review of Systems  All other systems reviewed and are negative.      Objective:   Physical Exam: 29 year old black female no acute distress very pleasant demeanor presents vital signs stable alert and oriented 3. Pulses are strongly palpable. Neurologic sensorium is intact per Semmes-Weinstein monofilament. Deep tendon reflexes are intact bilateral muscle strength +5 over 5 dorsiflexion plantar flexors and inverters everters all intrinsic musculature intact. Orthopedic evaluation does demonstrate pes planus with abduction of the forefoot on the rear foot. Adductovarus rotated hammertoe deformities fifth bilateral with overlying reactive hyperkeratosis. No open lesions no skin breakdown. Radiographs taken demonstrate pes planus bilateral with adductovarus rotated hammertoe deformities.     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  Objective: Vital signs are stable she is alert and oriented 3 I encouraged her to wear wider shoe gear and deeper shoe gear I also placed her in a pair of orthotics to help with forefoot abduction.

## 2015-02-28 ENCOUNTER — Encounter: Payer: 59 | Admitting: Podiatry

## 2015-03-05 ENCOUNTER — Ambulatory Visit (INDEPENDENT_AMBULATORY_CARE_PROVIDER_SITE_OTHER): Payer: 59 | Admitting: Podiatry

## 2015-03-05 ENCOUNTER — Encounter: Payer: Self-pay | Admitting: Podiatry

## 2015-03-05 DIAGNOSIS — M722 Plantar fascial fibromatosis: Secondary | ICD-10-CM

## 2015-03-05 DIAGNOSIS — Q6650 Congenital pes planus, unspecified foot: Secondary | ICD-10-CM

## 2015-03-05 NOTE — Progress Notes (Signed)
She presents today to pick up her orthotics. She denies any changes in past history medications or allergies. She was given both oral and written home going instructions for care of use of her orthotics she will follow-up with me in 4-6 weeks.

## 2015-03-05 NOTE — Patient Instructions (Signed)

## 2015-04-30 ENCOUNTER — Telehealth: Payer: Self-pay

## 2015-04-30 MED ORDER — METRONIDAZOLE 0.75 % VA GEL: 1.0000 | Freq: Two times a day (BID) | VAGINAL | Status: DC

## 2015-04-30 NOTE — Telephone Encounter (Signed)
Approved RX-aa

## 2015-09-13 ENCOUNTER — Ambulatory Visit: Payer: Self-pay | Admitting: Physician Assistant

## 2015-09-13 ENCOUNTER — Encounter: Payer: Self-pay | Admitting: Physician Assistant

## 2015-09-13 VITALS — BP 110/70 | HR 86 | Temp 99.5°F

## 2015-09-13 DIAGNOSIS — J019 Acute sinusitis, unspecified: Secondary | ICD-10-CM

## 2015-09-13 DIAGNOSIS — J309 Allergic rhinitis, unspecified: Secondary | ICD-10-CM

## 2015-09-13 DIAGNOSIS — Z299 Encounter for prophylactic measures, unspecified: Secondary | ICD-10-CM

## 2015-09-13 DIAGNOSIS — H65192 Other acute nonsuppurative otitis media, left ear: Secondary | ICD-10-CM

## 2015-09-13 MED ORDER — FLUTICASONE PROPIONATE 50 MCG/ACT NA SUSP: 2.0000 | Freq: Every day | NASAL | Status: DC

## 2015-09-13 MED ORDER — FLUCONAZOLE 150 MG PO TABS: ORAL_TABLET | ORAL | Status: DC

## 2015-09-13 MED ORDER — AMOXICILLIN 875 MG PO TABS: 875.0000 mg | ORAL_TABLET | Freq: Two times a day (BID) | ORAL | Status: DC

## 2015-09-13 NOTE — Progress Notes (Signed)
S seasonal allergies, now with acute sxs, fever, facial pain and pressure, teeth hurt  , blowing green, achy   Ear pain  No GI sxs O/ low grade temp, mildly ill, ENT left tm injected, dull, nasal mucosa  Swollen, red with purulent rhinorhea, + max tenderness , throat injected , neck supple with tender nodes, heart rsr lungs clear A/ acute and  Allergic rhinosinusitis early left otitis media P/ amoxicillan , flonase and diflucan . Supportive measures discussed. Follow up prn not improving

## 2016-02-25 DIAGNOSIS — Z124 Encounter for screening for malignant neoplasm of cervix: Secondary | ICD-10-CM | POA: Diagnosis not present

## 2016-02-25 DIAGNOSIS — Z0000 Encounter for general adult medical examination without abnormal findings: Secondary | ICD-10-CM | POA: Diagnosis not present

## 2016-02-25 DIAGNOSIS — R875 Abnormal microbiological findings in specimens from female genital organs: Secondary | ICD-10-CM | POA: Diagnosis not present

## 2016-02-25 DIAGNOSIS — Z13220 Encounter for screening for lipoid disorders: Secondary | ICD-10-CM | POA: Diagnosis not present

## 2016-02-25 DIAGNOSIS — Z01419 Encounter for gynecological examination (general) (routine) without abnormal findings: Secondary | ICD-10-CM | POA: Diagnosis not present

## 2016-02-25 DIAGNOSIS — Z1321 Encounter for screening for nutritional disorder: Secondary | ICD-10-CM | POA: Diagnosis not present

## 2016-02-25 DIAGNOSIS — Z113 Encounter for screening for infections with a predominantly sexual mode of transmission: Secondary | ICD-10-CM | POA: Diagnosis not present

## 2016-03-14 DIAGNOSIS — N852 Hypertrophy of uterus: Secondary | ICD-10-CM | POA: Diagnosis not present

## 2016-03-14 DIAGNOSIS — N938 Other specified abnormal uterine and vaginal bleeding: Secondary | ICD-10-CM | POA: Diagnosis not present

## 2016-03-18 DIAGNOSIS — N939 Abnormal uterine and vaginal bleeding, unspecified: Secondary | ICD-10-CM | POA: Diagnosis not present

## 2016-12-22 ENCOUNTER — Telehealth: Payer: 59 | Admitting: Family

## 2016-12-22 DIAGNOSIS — J019 Acute sinusitis, unspecified: Secondary | ICD-10-CM

## 2016-12-22 MED ORDER — AMOXICILLIN-POT CLAVULANATE 875-125 MG PO TABS: 1.0000 | ORAL_TABLET | Freq: Two times a day (BID) | ORAL | 0 refills | Status: DC

## 2016-12-22 NOTE — Progress Notes (Signed)

## 2017-01-15 ENCOUNTER — Ambulatory Visit (INDEPENDENT_AMBULATORY_CARE_PROVIDER_SITE_OTHER): Payer: 59 | Admitting: Obstetrics and Gynecology

## 2017-01-15 ENCOUNTER — Encounter: Payer: Self-pay | Admitting: Obstetrics and Gynecology

## 2017-01-15 VITALS — BP 102/66 | HR 99 | Ht 61.0 in | Wt 149.0 lb

## 2017-01-15 DIAGNOSIS — N898 Other specified noninflammatory disorders of vagina: Secondary | ICD-10-CM | POA: Diagnosis not present

## 2017-01-15 DIAGNOSIS — Z113 Encounter for screening for infections with a predominantly sexual mode of transmission: Secondary | ICD-10-CM

## 2017-01-15 LAB — POCT WET PREP WITH KOH
Clue Cells Wet Prep HPF POC: NEGATIVE
KOH PREP POC: NEGATIVE
Trichomonas, UA: NEGATIVE
YEAST WET PREP PER HPF POC: NEGATIVE

## 2017-01-15 NOTE — Progress Notes (Signed)
Chief Complaint  Patient presents with  . Vaginitis    possible yeast infection    HPI:      Ms. Karen Walton is a 31 y.o. No obstetric history on file. who LMP was Patient's last menstrual period was 12/31/2016 (exact date)., presents today for increased white vaginal d/c, without itch/burn/odor. Sx for 3-4 wks. Recent abx use for sinusitis 7/18. No LBP, belly pain, fevers, urin sx. No meds to treat. Not currently sex active (since 2016). Hx of trich in the past and treated with flagyl, but pt couldn't finish tx due to vomiting. Pt has been without sx since but is concerned trich not treated.     Past Medical History:  Diagnosis Date  . No known health problems     Past Surgical History:  Procedure Laterality Date  . CHOLECYSTECTOMY  2014    Family History  Problem Relation Age of Onset  . Cancer Paternal Grandfather        brain  . Diabetes Neg Hx   . Hypertension Neg Hx   . Thyroid disease Neg Hx   . Hyperlipidemia Neg Hx     Social History   Social History  . Marital status: Single    Spouse name: N/A  . Number of children: N/A  . Years of education: N/A   Occupational History  . Not on file.   Social History Main Topics  . Smoking status: Never Smoker  . Smokeless tobacco: Never Used  . Alcohol use No  . Drug use: No  . Sexual activity: Not Currently    Birth control/ protection: None   Other Topics Concern  . Not on file   Social History Narrative  . No narrative on file    No current outpatient prescriptions on file.   ROS:  Review of Systems  Constitutional: Negative for fever.  Gastrointestinal: Negative for blood in stool, constipation, diarrhea, nausea and vomiting.  Genitourinary: Positive for vaginal discharge. Negative for dyspareunia, dysuria, flank pain, frequency, hematuria, urgency, vaginal bleeding and vaginal pain.  Musculoskeletal: Negative for back pain.  Skin: Negative for rash.     OBJECTIVE:   Vitals:  BP  102/66   Pulse 99   Ht 5\' 1"  (1.549 m)   Wt 149 lb (67.6 kg)   LMP 12/31/2016 (Exact Date)   BMI 28.15 kg/m   Physical Exam  Constitutional: She is oriented to person, place, and time and well-developed, well-nourished, and in no distress. Vital signs are normal.  Genitourinary: Uterus normal, cervix normal, right adnexa normal, left adnexa normal and vulva normal. Uterus is not enlarged. Cervix exhibits no motion tenderness and no tenderness. Right adnexum displays no mass and no tenderness. Left adnexum displays no mass and no tenderness. Vulva exhibits no erythema, no exudate, no lesion, no rash and no tenderness. Vagina exhibits no lesion. Thin  odorless  white and vaginal discharge found.  Neurological: She is oriented to person, place, and time.  Vitals reviewed.   Results: Results for orders placed or performed in visit on 01/15/17 (from the past 24 hour(s))  POCT Wet Prep with KOH     Status: Normal   Collection Time: 01/15/17  4:54 PM  Result Value Ref Range   Trichomonas, UA Negative    Clue Cells Wet Prep HPF POC neg    Epithelial Wet Prep HPF POC  Few, Moderate, Many, Too numerous to count   Yeast Wet Prep HPF POC neg    Bacteria Wet Prep HPF  POC  Few   RBC Wet Prep HPF POC     WBC Wet Prep HPF POC     KOH Prep POC Negative Negative     Assessment/Plan: Vaginal discharge - Neg wet prep/normal exam. Check gon/chlam/trich. If neg, Reassurance. If develops itch due to recent abx use, can treat for yeast.  - Plan: POCT Wet Prep with KOH  Screening for STD (sexually transmitted disease) - Plan: Chlamydia/Gonococcus/Trichomonas, NAA     Return in about 2 months (around 03/17/2017), or if symptoms worsen or fail to improve, for annual.  Alicia B. Copland, PA-C 01/15/2017 4:57 PM

## 2017-01-22 LAB — CHLAMYDIA/GONOCOCCUS/TRICHOMONAS, NAA
CHLAMYDIA BY NAA: NEGATIVE
GONOCOCCUS BY NAA: NEGATIVE
Trich vag by NAA: NEGATIVE

## 2017-02-20 ENCOUNTER — Encounter: Payer: Self-pay | Admitting: Obstetrics and Gynecology

## 2017-02-21 ENCOUNTER — Other Ambulatory Visit: Payer: Self-pay | Admitting: Obstetrics and Gynecology

## 2017-02-21 MED ORDER — FLUCONAZOLE 150 MG PO TABS: 150.0000 mg | ORAL_TABLET | Freq: Once | ORAL | 0 refills | Status: AC

## 2017-02-21 NOTE — Progress Notes (Signed)
Yeast vag sx persist after neg eval.

## 2017-03-03 ENCOUNTER — Ambulatory Visit: Payer: 59 | Admitting: Family Medicine

## 2017-03-24 ENCOUNTER — Telehealth: Payer: 59 | Admitting: Family

## 2017-03-24 DIAGNOSIS — R110 Nausea: Secondary | ICD-10-CM | POA: Diagnosis not present

## 2017-03-24 MED ORDER — ONDANSETRON 4 MG PO TBDP: 4.0000 mg | ORAL_TABLET | Freq: Three times a day (TID) | ORAL | 0 refills | Status: DC | PRN

## 2017-03-24 NOTE — Progress Notes (Signed)
We are sorry that you are not feeling well. Here is how we plan to help!  Based on what you have shared with me it looks like you have a Virus that is  Vomiting is the forceful emptying of a portion of the stomach's content through the mouth.  Although nausea and vomiting can make you feel miserable, it's important to remember that these are not diseases, but rather symptoms of an underlying illness.  When we treat short term symptoms, we always caution that any symptoms that persist should be fully evaluated in a medical office.  I have prescribed a medication that will help alleviate your symptoms and allow you to stay hydrated:  Zofran 4 mg 1 tablet every 8 hours as needed for nausea and vomiting  HOME CARE:  Drink clear liquids.  This is very important! Dehydration (the lack of fluid) can lead to a serious complication.  Start off with 1 tablespoon every 5 minutes for 8 hours.  You may begin eating bland foods after 8 hours without vomiting.  Start with saltine crackers, white bread, rice, mashed potatoes, applesauce.  After 48 hours on a bland diet, you may resume a normal diet.  Try to go to sleep.  Sleep often empties the stomach and relieves the need to vomit.  GET HELP RIGHT AWAY IF:   Your symptoms do not improve or worsen within 2 days after treatment.  You have a fever for over 3 days.  You cannot keep down fluids after trying the medication.  MAKE SURE YOU:   Understand these instructions.  Will watch your condition.  Will get help right away if you are not doing well or get worse.   Thank you for choosing an e-visit. Your e-visit answers were reviewed by a board certified advanced clinical practitioner to complete your personal care plan. Depending upon the condition, your plan could have included both over the counter or prescription medications. Please review your pharmacy choice. Be sure that the pharmacy you have chosen is open so that you can pick up your  prescription now.  If there is a problem you may message your provider in MyChart to have the prescription routed to another pharmacy. Your safety is important to us. If you have drug allergies check your prescription carefully.  For the next 24 hours, you can use MyChart to ask questions about today's visit, request a non-urgent call back, or ask for a work or school excuse from your e-visit provider. You will get an e-mail in the next two days asking about your experience. I hope that your e-visit has been valuable and will speed your recovery.

## 2017-08-04 ENCOUNTER — Encounter: Payer: Self-pay | Admitting: Family Medicine

## 2017-08-04 ENCOUNTER — Ambulatory Visit: Payer: Self-pay | Admitting: Family Medicine

## 2017-08-04 VITALS — BP 121/68 | HR 96 | Temp 98.4°F | Wt 163.0 lb

## 2017-08-04 DIAGNOSIS — S46912A Strain of unspecified muscle, fascia and tendon at shoulder and upper arm level, left arm, initial encounter: Secondary | ICD-10-CM

## 2017-08-04 MED ORDER — MELOXICAM 7.5 MG PO TABS: 7.5000 mg | ORAL_TABLET | Freq: Every day | ORAL | 0 refills | Status: AC

## 2017-08-04 MED ORDER — CYCLOBENZAPRINE HCL 10 MG PO TABS: 10.0000 mg | ORAL_TABLET | Freq: Every day | ORAL | 0 refills | Status: DC

## 2017-08-04 NOTE — Progress Notes (Signed)
Karen PrimeRachelle L Walton is a 32 y.o. female who is seeking care for a left shoulder pain. She reports that she does not recall any trauma or injury to the area and believes that she slept wrong. She reports a pain that is a dull ache when her shoulder is still and with movement a 6/10 that she considers sharp. She has self treated with NSAID and pain relief medications for the past week without any relief.  Review of Systems  Constitutional: Negative.   HENT: Negative.   Eyes: Negative.   Cardiovascular: Negative.   Gastrointestinal: Negative.   Genitourinary: Negative.   Musculoskeletal:       Left posterior shoulder pain on the medial boarder of the scapula-   Skin: Negative.   Neurological: Negative.   Endo/Heme/Allergies: Negative.   Psychiatric/Behavioral: Negative.     O: Vitals:   08/04/17 1155  BP: 121/68  Pulse: 96  Temp: 98.4 F (36.9 C)  SpO2: 98%   .Physical Exam  Constitutional: She is oriented to person, place, and time. She appears well-developed and well-nourished.  HENT:  Head: Normocephalic.  Eyes: Pupils are equal, round, and reactive to light.  Neck: Normal range of motion. Neck supple.  Cardiovascular: Normal rate.  Pulmonary/Chest: Effort normal and breath sounds normal.  Abdominal: Soft. Bowel sounds are normal.  Musculoskeletal: Normal range of motion.       Right shoulder: Normal. She exhibits no tenderness.       Left shoulder: She exhibits tenderness and pain.       Arms: Point tenderness and pain with active and passive ROM. Most severe  When arms move with downward motion from raised position.  Neurological: She is alert and oriented to person, place, and time.  Skin: Skin is warm and dry.   A: 1. Shoulder strain, left, initial encounter    P: 1. Shoulder strain, left, initial encounter - cyclobenzaprine (FLEXERIL) 10 MG tablet; Take 1 tablet (10 mg total) by mouth at bedtime. - meloxicam (MOBIC) 7.5 MG tablet; Take 1 tablet (7.5 mg total) by  mouth daily for 14 days. PLAN< Anti-inflammatory- x 14 days with largest meal Tylenol for breakthrough pain as needed Muscle Relaxant as needed at night NON-Pharmacologic options:  Heat pack, icy hot, bengay, biofreeze, Range of motion exercises F/U with PCP if symptoms persist or are unimproved at end of treatment period.

## 2017-08-04 NOTE — Progress Notes (Signed)
C/o left shoulder blade ? Muscle spasm x 1wk OTC: aleve and tylenol

## 2017-08-04 NOTE — Patient Instructions (Addendum)
PLAN< Anti-inflammatory- x 14 days with largest meal Tylenol for breakthrough pain as needed Muscle Relaxant as needed at night NON-Pharmacologic options:  Heat pack, icy hot, bengay, biofreeze, Range of motion exercises (PHASE 1 ONLY!!!)  Shoulder Sprain A shoulder sprain is a partial or complete tear in one of the tough, fiber-like tissues (ligaments) in the shoulder. The ligaments in the shoulder help to hold the shoulder in place. What are the causes? This condition may be caused by:  A fall.  A hit to the shoulder.  A twist of the arm.  What increases the risk? This condition is more likely to develop in:  People who play sports.  People who have problems with balance or coordination.  What are the signs or symptoms? Symptoms of this condition include:  Pain when moving the shoulder.  Limited ability to move the shoulder.  Swelling and tenderness on top of the shoulder.  Warmth in the shoulder.  A change in the shape of the shoulder.  Redness or bruising on the shoulder.  How is this diagnosed? This condition is diagnosed with a physical exam. During the exam, you may be asked to do simple exercises with your shoulder. You may also have imaging tests, such as X-rays, MRI, or a CT scan. These tests can show how severe the sprain is. How is this treated? This condition may be treated with:  Rest.  Pain medicine.  Ice.  A sling or brace. This is used to keep the arm still while the shoulder is healing.  Physical therapy or rehabilitation exercises. These help to improve the range of motion and strength of the shoulder.  Surgery (rare). Surgery may be needed if the sprain caused a joint to become unstable. Surgery may also be needed to reduce pain.  Some people may develop ongoing shoulder pain or lose some range of motion in the shoulder. However, most people do not develop long-term problems. Follow these instructions at home:  Rest.  Ask your health  care provider when it is safe for you to drive if you have a sling or brace on your shoulder.  Take over-the-counter and prescription medicines only as told by your health care provider.  If directed, apply ice to the area: ? Put ice in a plastic bag. ? Place a towel between your skin and the bag. ? Leave the ice on for 20 minutes, 2-3 times per day.  If you were given a shoulder sling or brace: ? Wear it as told. ? Remove it to shower or bathe. ? Move your arm only as much as told by your health care provider, but keep your hand moving to prevent swelling.  If you were shown how to do any exercises, do them as told by your health care provider.  Keep all follow-up visits as told by your health care provider. This is important. Contact a health care provider if:  Your pain gets worse.  Your pain is not relieved with medicines.  You have increased redness or swelling. Get help right away if:  You have a fever.  You cannot move your arm or shoulder.  You develop severe numbness or tingling in your arm, hand, or fingers.  Your arm, hand, or fingers turn blue, white, or gray and feel cold. This information is not intended to replace advice given to you by your health care provider. Make sure you discuss any questions you have with your health care provider. Document Released: 09/28/2008 Document Revised: 01/06/2016 Document Reviewed:  09/04/2014 Elsevier Interactive Patient Education  2018 [employer].  Shoulder Range of Motion Exercises Shoulder range of motion (ROM) exercises are designed to keep the shoulder moving freely. They are often recommended for people who have shoulder pain. Phase 1 exercises When you are able, do this exercise 5-6 days per week, or as told by your health care provider. Work toward doing 2 sets of 10 swings. Pendulum Exercise How To Do This Exercise Lying Down 1. Lie face-down on a bed with your abdomen close to the side of the bed. 2. Let your  arm hang over the side of the bed. 3. Relax your shoulder, arm, and hand. 4. Slowly and gently swing your arm forward and back. Do not use your neck muscles to swing your arm. They should be relaxed. If you are struggling to swing your arm, have someone gently swing it for you. When you do this exercise for the first time, swing your arm at a 15 degree angle for 15 seconds, or swing your arm 10 times. As pain lessens over time, increase the angle of the swing to 30-45 degrees. 5. Repeat steps 1-4 with the other arm.  How To Do This Exercise While Standing 1. Stand next to a sturdy chair or table and hold on to it with your hand. 1. Bend forward at the waist. 2. Bend your knees slightly. 3. Relax your other arm and let it hang limp. 4. Relax the shoulder blade of the arm that is hanging and let it drop. 5. While keeping your shoulder relaxed, use body motion to swing your arm in small circles. The first time you do this exercise, swing your arm for about 30 seconds or 10 times. When you do it next time, swing your arm for a little longer. 6. Stand up tall and relax. 7. Repeat steps 1-7, this time changing the direction of the circles. 2. Repeat steps 1-8 with the other arm.  Phase 2 exercises Do these exercises 3-4 times per day on 5-6 days per week or as told by your health care provider. Work toward holding the stretch for 20 seconds. Stretching Exercise 1 1. Lift your arm straight out in front of you. 2. Bend your arm 90 degrees at the elbow (right angle) so your forearm goes across your body and looks like the letter "L." 3. Use your other arm to gently pull the elbow forward and across your body. 4. Repeat steps 1-3 with the other arm. Stretching Exercise 2 You will need a towel or rope for this exercise. 1. Bend one arm behind your back with the palm facing outward. 2. Hold a towel with your other hand. 3. Reach the arm that holds the towel above your head, and bend that arm at the  elbow. Your wrist should be behind your neck. 4. Use your free hand to grab the free end of the towel. 5. With the higher hand, gently pull the towel up behind you. 6. With the lower hand, pull the towel down behind you. 7. Repeat steps 1-6 with the other arm.  Phase 3 exercises Do each of these exercises at four different times of day (sessions) every day or as told by your health care provider. To begin with, repeat each exercise 5 times (repetitions). Work toward doing 3 sets of 12 repetitions or as told by your health care provider. Strengthening Exercise 1 You will need a light weight for this activity. As you grow stronger, you may use a heavier  weight. 1. Standing with a weight in your hand, lift your arm straight out to the side until it is at the same height as your shoulder. 2. Bend your arm at 90 degrees so that your fingers are pointing to the ceiling. 3. Slowly raise your hand until your arm is straight up in the air. 4. Repeat steps 1-3 with the other arm.  Strengthening Exercise 2 You will need a light weight for this activity. As you grow stronger, you may use a heavier weight. 1. Standing with a weight in your hand, gradually move your straight arm in an arc, starting at your side, then out in front of you, then straight up over your head. 2. Gradually move your other arm in an arc, starting at your side, then out in front of you, then straight up over your head. 3. Repeat steps 1-2 with the other arm.  Strengthening Exercise 3 You will need an elastic band for this activity. As you grow stronger, gradually increase the size of the bands or increase the number of bands that you use at one time. 1. While standing, hold an elastic band in one hand and raise that arm up in the air. 2. With your other hand, pull down the band until that hand is by your side. 3. Repeat steps 1-2 with the other arm.  This information is not intended to replace advice given to you by your health  care provider. Make sure you discuss any questions you have with your health care provider. Document Released: 02/08/2003 Document Revised: 01/06/2016 Document Reviewed: 05/08/2014 Elsevier Interactive Patient Education  [employer]2018 [employer]

## 2017-08-06 ENCOUNTER — Telehealth: Payer: Self-pay | Admitting: Emergency Medicine

## 2017-08-06 NOTE — Telephone Encounter (Signed)
Unable to leave message

## 2017-08-24 ENCOUNTER — Ambulatory Visit (INDEPENDENT_AMBULATORY_CARE_PROVIDER_SITE_OTHER): Payer: 59 | Admitting: Family Medicine

## 2017-08-24 ENCOUNTER — Encounter: Payer: Self-pay | Admitting: Family Medicine

## 2017-08-24 VITALS — BP 120/77 | HR 82 | Temp 98.6°F | Ht 61.75 in | Wt 158.5 lb

## 2017-08-24 DIAGNOSIS — Z124 Encounter for screening for malignant neoplasm of cervix: Secondary | ICD-10-CM

## 2017-08-24 DIAGNOSIS — Z0001 Encounter for general adult medical examination with abnormal findings: Secondary | ICD-10-CM

## 2017-08-24 DIAGNOSIS — Z0000 Encounter for general adult medical examination without abnormal findings: Secondary | ICD-10-CM | POA: Diagnosis not present

## 2017-08-24 DIAGNOSIS — J309 Allergic rhinitis, unspecified: Secondary | ICD-10-CM | POA: Diagnosis not present

## 2017-08-24 DIAGNOSIS — Z113 Encounter for screening for infections with a predominantly sexual mode of transmission: Secondary | ICD-10-CM

## 2017-08-24 DIAGNOSIS — Z7689 Persons encountering health services in other specified circumstances: Secondary | ICD-10-CM

## 2017-08-24 LAB — UA/M W/RFLX CULTURE, ROUTINE
Bilirubin, UA: NEGATIVE
Glucose, UA: NEGATIVE
LEUKOCYTES UA: NEGATIVE
Nitrite, UA: NEGATIVE
Protein, UA: NEGATIVE
Specific Gravity, UA: 1.025 (ref 1.005–1.030)
Urobilinogen, Ur: 0.2 mg/dL (ref 0.2–1.0)
pH, UA: 6 (ref 5.0–7.5)

## 2017-08-24 LAB — MICROSCOPIC EXAMINATION: Bacteria, UA: NONE SEEN

## 2017-08-24 NOTE — Progress Notes (Deleted)
   BP 120/77 (BP Location: Left Arm, Patient Position: Sitting, Cuff Size: Normal)   Pulse 82   Temp 98.6 F (37 C) (Tympanic)   Ht 5' 1.75" (1.568 m)   Wt 158 lb 8 oz (71.9 kg)   SpO2 95%   BMI 29.23 kg/m    Subjective:    Patient ID: Karen Walton, female    DOB: May 29, 1985, 32 y.o.   MRN: [id number]  HPI: Karen Walton is a 32 y.o. female  Chief Complaint  Patient presents with  . Establish Care    No Complaints   Pt here today to establish care. No concerns today. No known medical problems other than allergic rhinitis for which she occasionally takes OTC allergy tablets. States this is under good control currently. Not currently taking any medications.   Last CPE was 1-2 years ago. Sees [facility] for her well woman exams but wanting to consolidate everything to here. Believes she is due for a pap smear. Wanting to get a CPE today if possible.   Not fasting for labs today.   Relevant past medical, surgical, family and social history reviewed and updated as indicated. Interim medical history since our last visit reviewed. Allergies and medications reviewed and updated.  Review of Systems  Per HPI unless specifically indicated above     Objective:    BP 120/77 (BP Location: Left Arm, Patient Position: Sitting, Cuff Size: Normal)   Pulse 82   Temp 98.6 F (37 C) (Tympanic)   Ht 5' 1.75" (1.568 m)   Wt 158 lb 8 oz (71.9 kg)   SpO2 95%   BMI 29.23 kg/m   Wt Readings from Last 3 Encounters:  08/24/17 158 lb 8 oz (71.9 kg)  08/04/17 163 lb (73.9 kg)  01/15/17 149 lb (67.6 kg)    Physical Exam  Results for orders placed or performed in visit on 08/24/17  HM PAP SMEAR  Result Value Ref Range   HM Pap smear Normal   HM HIV SCREENING LAB  Result Value Ref Range   HM HIV Screening Negative - Validated       Assessment & Plan:   Problem List Items Addressed This Visit    None       Follow up plan: No follow-ups on file.

## 2017-08-26 LAB — IGP, APTIMA HPV, RFX 16/18,45
HPV Aptima: NEGATIVE
PAP Smear Comment: 0

## 2017-08-26 LAB — GC/CHLAMYDIA PROBE AMP
Chlamydia trachomatis, NAA: NEGATIVE
Neisseria gonorrhoeae by PCR: NEGATIVE

## 2017-08-26 NOTE — Assessment & Plan Note (Signed)
Stable without medications currently, continue taking OTC antihistamines prn

## 2017-08-26 NOTE — Progress Notes (Signed)
BP 120/77 (BP Location: Left Arm, Patient Position: Sitting, Cuff Size: Normal)   Pulse 82   Temp 98.6 F (37 C) (Tympanic)   Ht 5' 1.75" (1.568 m)   Wt 158 lb 8 oz (71.9 kg)   SpO2 95%   BMI 29.23 kg/m    Subjective:    Patient ID: Karen Walton, female    DOB: Apr 30, 1986, 32 y.o.   MRN: [id number]  HPI: Karen Walton is a 32 y.o. female presenting on 08/24/2017 for comprehensive medical examination. Current medical complaints include:none  Pt here today to establish care. No concerns today. No known medical problems other than allergic rhinitis for which she occasionally takes OTC allergy tablets. States this is under good control currently. Not currently taking any medications.   Last CPE was 1-2 years ago. Sees [facility] for her well woman exams but wanting to consolidate everything to here. Believes she is due for a pap smear. Wanting to get a CPE today if possible.   Not fasting for labs today.   Depression Screen done today and results listed below:  Depression screen [facility] 2/9 08/26/2017  Decreased Interest 0  Down, Depressed, Hopeless 0  PHQ - 2 Score 0    The patient does not have a history of falls. I did not complete a risk assessment for falls. A plan of care for falls was not documented.   Past Medical History:  Past Medical History:  Diagnosis Date  . Allergy     Surgical History:  Past Surgical History:  Procedure Laterality Date  . CHOLECYSTECTOMY  2014  . TUBAL LIGATION      Medications:  No current outpatient medications on file prior to visit.   No current facility-administered medications on file prior to visit.     Allergies:  Allergies  Allergen Reactions  . Flagyl [Metronidazole] Nausea And Vomiting    Social History:  Social History   Socioeconomic History  . Marital status: Single    Spouse name: Not on file  . Number of children: Not on file  . Years of education: Not on file  . Highest education level: Not on file    Occupational History  . Not on file  Social Needs  . Financial resource strain: Not on file  . Food insecurity:    Worry: Not on file    Inability: Not on file  . Transportation needs:    Medical: Not on file    Non-medical: Not on file  Tobacco Use  . Smoking status: Never Smoker  . Smokeless tobacco: Never Used  Substance and Sexual Activity  . Alcohol use: Yes    Alcohol/week: 1.2 oz    Types: 2 Glasses of wine per week    Comment: Socially  . Drug use: No  . Sexual activity: Yes    Birth control/protection: None, Surgical  Lifestyle  . Physical activity:    Days per week: Not on file    Minutes per session: Not on file  . Stress: Not on file  Relationships  . Social connections:    Talks on phone: Not on file    Gets together: Not on file    Attends religious service: Not on file    Active member of club or organization: Not on file    Attends meetings of clubs or organizations: Not on file    Relationship status: Not on file  . Intimate partner violence:    Fear of current or ex partner: Not on  file    Emotionally abused: Not on file    Physically abused: Not on file    Forced sexual activity: Not on file  Other Topics Concern  . Not on file  Social History Narrative  . Not on file   Social History   Tobacco Use  Smoking Status Never Smoker  Smokeless Tobacco Never Used   Social History   Substance and Sexual Activity  Alcohol Use Yes  . Alcohol/week: 1.2 oz  . Types: 2 Glasses of wine per week   Comment: Socially    Family History:  Family History  Problem Relation Age of Onset  . Cancer Paternal Grandfather        brain  . Diabetes Mother   . Hypertension Mother   . Hyperlipidemia Sister   . Hypertension Sister   . Depression Maternal Grandmother   . Osteoporosis Maternal Grandmother   . Brain cancer Maternal Grandfather   . Diabetes Paternal Grandmother   . COPD Paternal Grandmother   . Congestive Heart Failure Paternal Grandmother    . Thyroid disease Neg Hx     Past medical history, surgical history, medications, allergies, family history and social history reviewed with patient today and changes made to appropriate areas of the chart.   Review of Systems - General ROS: negative Psychological ROS: negative Ophthalmic ROS: negative ENT ROS: negative Allergy and Immunology ROS: negative Breast ROS: negative for breast lumps Respiratory ROS: no cough, shortness of breath, or wheezing Cardiovascular ROS: no chest pain or dyspnea on exertion Gastrointestinal ROS: no abdominal pain, change in bowel habits, or black or bloody stools Genito-Urinary ROS: no dysuria, trouble voiding, or hematuria Musculoskeletal ROS: negative Neurological ROS: no TIA or stroke symptoms Dermatological ROS: negative All other ROS negative except what is listed above and in the HPI.      Objective:    BP 120/77 (BP Location: Left Arm, Patient Position: Sitting, Cuff Size: Normal)   Pulse 82   Temp 98.6 F (37 C) (Tympanic)   Ht 5' 1.75" (1.568 m)   Wt 158 lb 8 oz (71.9 kg)   SpO2 95%   BMI 29.23 kg/m   Wt Readings from Last 3 Encounters:  08/24/17 158 lb 8 oz (71.9 kg)  08/04/17 163 lb (73.9 kg)  01/15/17 149 lb (67.6 kg)    Physical Exam  Constitutional: She is oriented to person, place, and time. She appears well-developed and well-nourished. No distress.  HENT:  Head: Atraumatic.  Right Ear: External ear normal.  Left Ear: External ear normal.  Nose: Nose normal.  Mouth/Throat: Oropharynx is clear and moist. No oropharyngeal exudate.  Eyes: Pupils are equal, round, and reactive to light. Conjunctivae are normal. No scleral icterus.  Neck: Normal range of motion. Neck supple. No thyromegaly present.  Cardiovascular: Normal rate, regular rhythm, normal heart sounds and intact distal pulses.  Pulmonary/Chest: Effort normal and breath sounds normal. No respiratory distress. Right breast exhibits no mass, no skin change and no  tenderness. Left breast exhibits no mass, no skin change and no tenderness.  Abdominal: Soft. Bowel sounds are normal. She exhibits no mass. There is no tenderness.  Genitourinary: Vagina normal and uterus normal. No vaginal discharge found.  Musculoskeletal: Normal range of motion. She exhibits no edema or tenderness.  Lymphadenopathy:    She has no cervical adenopathy.    She has no axillary adenopathy.  Neurological: She is alert and oriented to person, place, and time. No cranial nerve deficit.  Skin: Skin is  warm and dry. No rash noted.  Psychiatric: She has a normal mood and affect. Her behavior is normal.  Nursing note and vitals reviewed.   Results for orders placed or performed in visit on 08/24/17  GC/Chlamydia Probe Amp  Result Value Ref Range   Chlamydia trachomatis, NAA Negative Negative   Neisseria gonorrhoeae by PCR Negative Negative  Microscopic Examination  Result Value Ref Range   WBC, UA 0-5 0 - 5 /hpf   RBC, UA 0-2 0 - 2 /hpf   Epithelial Cells (non renal) 0-10 0 - 10 /hpf   Bacteria, UA None seen None seen/Few  CBC with Differential/Platelet  Result Value Ref Range   WBC CANCELED x10E3/uL   RBC CANCELED    Hemoglobin CANCELED    Hematocrit CANCELED    Platelets CANCELED    Neutrophils CANCELED    Lymphs CANCELED    Monocytes CANCELED    Eos CANCELED    Lymphocytes Absolute CANCELED    EOS (ABSOLUTE) CANCELED    Basophils Absolute CANCELED   Comprehensive metabolic panel  Result Value Ref Range   Glucose CANCELED mg/dL   BUN CANCELED    Creatinine, Ser CANCELED    Sodium CANCELED    Potassium CANCELED    Chloride CANCELED    CO2 CANCELED    Calcium CANCELED    Total Protein CANCELED    Albumin CANCELED    Bilirubin Total CANCELED    Alkaline Phosphatase CANCELED    AST CANCELED    ALT CANCELED   Lipid Panel w/o Chol/HDL Ratio  Result Value Ref Range   Cholesterol, Total CANCELED mg/dL   Triglycerides CANCELED    HDL CANCELED   TSH    Result Value Ref Range   TSH CANCELED uIU/mL  UA/M w/rflx Culture, Routine  Result Value Ref Range   Specific Gravity, UA 1.025 1.005 - 1.030   pH, UA 6.0 5.0 - 7.5   Color, UA Yellow Yellow   Appearance Ur Hazy (A) Clear   Leukocytes, UA Negative Negative   Protein, UA Negative Negative/Trace   Glucose, UA Negative Negative   Ketones, UA Trace (A) Negative   RBC, UA Trace (A) Negative   Bilirubin, UA Negative Negative   Urobilinogen, Ur 0.2 0.2 - 1.0 mg/dL   Nitrite, UA Negative Negative   Microscopic Examination See below:   HIV antibody  Result Value Ref Range   HIV Screen 4th Generation wRfx WILL FOLLOW   RPR  Result Value Ref Range   RPR Ser Ql CANCELED   HSV(herpes simplex vrs) 1+2 ab-IgG  Result Value Ref Range   HSV 1 Glycoprotein G Ab, IgG CANCELED index   HSV 2 IgG, Type Spec CANCELED   IGP, Aptima HPV, rfx 16/18,45  Result Value Ref Range   DIAGNOSIS: Comment    Specimen adequacy: Comment    Clinician Provided ICD10 Comment    Performed by: Comment    PAP Smear Comment .    Note: Comment    Test Methodology Comment    HPV Aptima Negative Negative      Assessment & Plan:   Problem List Items Addressed This Visit      Respiratory   Allergic rhinitis - Primary    Stable without medications currently, continue taking OTC antihistamines prn       Other Visit Diagnoses    Encounter to establish care       Annual physical exam       Relevant Orders   CBC with  Differential/Platelet (Completed)   Comprehensive metabolic panel (Completed)   Lipid Panel w/o Chol/HDL Ratio (Completed)   TSH (Completed)   UA/M w/rflx Culture, Routine (Completed)   Screening for cervical cancer       Relevant Orders   IGP, Aptima HPV, rfx 16/18,45 (Completed)   Routine screening for STI (sexually transmitted infection)       Relevant Orders   HIV antibody (Completed)   RPR (Completed)   HSV(herpes simplex vrs) 1+2 ab-IgG (Completed)   GC/Chlamydia Probe Amp (Completed)        Follow up plan: Return in about 1 year (around 08/25/2018) for CPE.   LABORATORY TESTING:  - Pap smear: pap done  IMMUNIZATIONS:   - Tdap: Tetanus vaccination status reviewed: last tetanus booster within 10 years. - Influenza: Up to date  PATIENT COUNSELING:   Advised to take 1 mg of folate supplement per day if capable of pregnancy.   Sexuality: Discussed sexually transmitted diseases, partner selection, use of condoms, avoidance of unintended pregnancy  and contraceptive alternatives.   Advised to avoid cigarette smoking.  I discussed with the patient that most people either abstain from alcohol or drink within safe limits (<=14/week and <=4 drinks/occasion for males, <=7/weeks and <= 3 drinks/occasion for females) and that the risk for alcohol disorders and other health effects rises proportionally with the number of drinks per week and how often a drinker exceeds daily limits.  Discussed cessation/primary prevention of drug use and availability of treatment for abuse.   Diet: Encouraged to adjust caloric intake to maintain  or achieve ideal body weight, to reduce intake of dietary saturated fat and total fat, to limit sodium intake by avoiding high sodium foods and not adding table salt, and to maintain adequate dietary potassium and calcium preferably from fresh fruits, vegetables, and low-fat dairy products.    stressed the importance of regular exercise  Injury prevention: Discussed safety belts, safety helmets, smoke detector, smoking near bedding or upholstery.   Dental health: Discussed importance of regular tooth brushing, flossing, and dental visits.    NEXT PREVENTATIVE PHYSICAL DUE IN 1 YEAR. Return in about 1 year (around 08/25/2018) for CPE.

## 2017-08-26 NOTE — Patient Instructions (Signed)
Follow up for CPE 

## 2017-08-27 LAB — HSV(HERPES SIMPLEX VRS) I + II AB-IGG

## 2017-08-27 LAB — RPR

## 2017-08-27 LAB — LIPID PANEL W/O CHOL/HDL RATIO

## 2017-08-27 LAB — COMPREHENSIVE METABOLIC PANEL

## 2017-08-27 LAB — TSH

## 2017-08-27 LAB — CBC WITH DIFFERENTIAL/PLATELET

## 2017-08-27 LAB — HIV ANTIBODY (ROUTINE TESTING W REFLEX)

## 2017-09-04 ENCOUNTER — Encounter: Payer: Self-pay | Admitting: Family Medicine

## 2017-09-04 ENCOUNTER — Other Ambulatory Visit: Payer: Self-pay | Admitting: Family Medicine

## 2017-09-04 ENCOUNTER — Other Ambulatory Visit: Payer: 59

## 2017-09-04 DIAGNOSIS — Z113 Encounter for screening for infections with a predominantly sexual mode of transmission: Secondary | ICD-10-CM

## 2017-09-04 DIAGNOSIS — Z0000 Encounter for general adult medical examination without abnormal findings: Secondary | ICD-10-CM

## 2017-09-07 ENCOUNTER — Other Ambulatory Visit: Payer: Self-pay | Admitting: Family Medicine

## 2017-09-07 ENCOUNTER — Other Ambulatory Visit: Payer: Self-pay

## 2017-09-07 DIAGNOSIS — Z0000 Encounter for general adult medical examination without abnormal findings: Secondary | ICD-10-CM | POA: Diagnosis not present

## 2017-09-07 DIAGNOSIS — Z113 Encounter for screening for infections with a predominantly sexual mode of transmission: Secondary | ICD-10-CM

## 2017-09-10 ENCOUNTER — Encounter: Payer: Self-pay | Admitting: Family Medicine

## 2017-09-10 LAB — CBC WITH DIFFERENTIAL/PLATELET
BASOS: 0 %
Basophils Absolute: 0 10*3/uL (ref 0.0–0.2)
EOS (ABSOLUTE): 0.1 10*3/uL (ref 0.0–0.4)
EOS: 2 %
HEMATOCRIT: 31.5 % — AB (ref 34.0–46.6)
HEMOGLOBIN: 10.4 g/dL — AB (ref 11.1–15.9)
IMMATURE GRANULOCYTES: 0 %
Immature Grans (Abs): 0 10*3/uL (ref 0.0–0.1)
LYMPHS ABS: 1.6 10*3/uL (ref 0.7–3.1)
Lymphs: 37 %
MCH: 29.8 pg (ref 26.6–33.0)
MCHC: 33 g/dL (ref 31.5–35.7)
MCV: 90 fL (ref 79–97)
MONOCYTES: 6 %
Monocytes Absolute: 0.3 10*3/uL (ref 0.1–0.9)
NEUTROS PCT: 55 %
Neutrophils Absolute: 2.4 10*3/uL (ref 1.4–7.0)
Platelets: 261 10*3/uL (ref 150–379)
RBC: 3.49 x10E6/uL — ABNORMAL LOW (ref 3.77–5.28)
RDW: 15.4 % (ref 12.3–15.4)
WBC: 4.4 10*3/uL (ref 3.4–10.8)

## 2017-09-10 LAB — COMPREHENSIVE METABOLIC PANEL
A/G RATIO: 1.5 (ref 1.2–2.2)
ALBUMIN: 3.8 g/dL (ref 3.5–5.5)
ALT: 15 IU/L (ref 0–32)
AST: 27 IU/L (ref 0–40)
Alkaline Phosphatase: 52 IU/L (ref 39–117)
BUN / CREAT RATIO: 19 (ref 9–23)
BUN: 12 mg/dL (ref 6–20)
Bilirubin Total: 0.5 mg/dL (ref 0.0–1.2)
CALCIUM: 8.5 mg/dL — AB (ref 8.7–10.2)
CO2: 20 mmol/L (ref 20–29)
CREATININE: 0.64 mg/dL (ref 0.57–1.00)
Chloride: 109 mmol/L — ABNORMAL HIGH (ref 96–106)
GFR, EST AFRICAN AMERICAN: 138 mL/min/{1.73_m2} (ref >59)
GFR, EST NON AFRICAN AMERICAN: 119 mL/min/{1.73_m2} (ref >59)
GLOBULIN, TOTAL: 2.6 g/dL (ref 1.5–4.5)
Glucose: 92 mg/dL (ref 65–99)
POTASSIUM: 4.3 mmol/L (ref 3.5–5.2)
SODIUM: 143 mmol/L (ref 134–144)
TOTAL PROTEIN: 6.4 g/dL (ref 6.0–8.5)

## 2017-09-10 LAB — LIPID PANEL W/O CHOL/HDL RATIO
Cholesterol, Total: 164 mg/dL (ref 100–199)
HDL: 58 mg/dL (ref >39)
LDL CALC: 91 mg/dL (ref 0–99)
Triglycerides: 77 mg/dL (ref 0–149)
VLDL Cholesterol Cal: 15 mg/dL (ref 5–40)

## 2017-09-10 LAB — TSH: TSH: 1.3 u[IU]/mL (ref 0.450–4.500)

## 2017-09-10 LAB — HIV ANTIBODY (ROUTINE TESTING W REFLEX): HIV SCREEN 4TH GENERATION: NONREACTIVE

## 2017-09-10 LAB — HSV(HERPES SIMPLEX VRS) I + II AB-IGG: HSV 1 Glycoprotein G Ab, IgG: 53.4 index — ABNORMAL HIGH (ref 0.00–0.90)

## 2017-09-10 LAB — RPR: RPR: NONREACTIVE

## 2017-09-23 ENCOUNTER — Ambulatory Visit: Payer: 59 | Admitting: Podiatry

## 2017-10-02 ENCOUNTER — Encounter: Payer: Self-pay | Admitting: Podiatry

## 2017-10-02 ENCOUNTER — Other Ambulatory Visit: Payer: Self-pay | Admitting: Podiatry

## 2017-10-02 ENCOUNTER — Ambulatory Visit: Payer: 59 | Admitting: Podiatry

## 2017-10-02 ENCOUNTER — Ambulatory Visit (INDEPENDENT_AMBULATORY_CARE_PROVIDER_SITE_OTHER): Payer: 59

## 2017-10-02 DIAGNOSIS — M2142 Flat foot [pes planus] (acquired), left foot: Principal | ICD-10-CM

## 2017-10-02 DIAGNOSIS — M2141 Flat foot [pes planus] (acquired), right foot: Secondary | ICD-10-CM | POA: Diagnosis not present

## 2017-10-02 DIAGNOSIS — M722 Plantar fascial fibromatosis: Secondary | ICD-10-CM

## 2017-10-02 DIAGNOSIS — L84 Corns and callosities: Secondary | ICD-10-CM

## 2017-10-02 MED ORDER — MELOXICAM 15 MG PO TABS: 15.0000 mg | ORAL_TABLET | Freq: Every day | ORAL | 1 refills | Status: AC

## 2017-10-05 NOTE — Progress Notes (Signed)
   Subjective: 32 year old female presenting today with a chief complaint of pain and tenderness noted to bilateral aches that has been ongoing for the past few years. She also reports corns to the fifth toes of bilateral feet. She has been wearing custom molded orthotics she received from Dr. Al Corpus back in 2016. She states the pain is worse in the morning when she first gets out of bed. Patient is here for further evaluation and treatment.   Past Medical History:  Diagnosis Date  . Allergy      Objective: Physical Exam General: The patient is alert and oriented x3 in no acute distress.  Dermatology: Hyperkeratotic lesions present on the bilateral fifth toes. Pain on palpation with a central nucleated core noted. Skin is warm, dry and supple bilateral lower extremities. Negative for open lesions or macerations bilateral.   Vascular: Dorsalis Pedis and Posterior Tibial pulses palpable bilateral.  Capillary fill time is immediate to all digits.  Neurological: Epicritic and protective threshold intact bilateral.   Musculoskeletal: Tenderness to palpation to the plantar aspect of the bilateral heels along the plantar fascia. All other joints range of motion within normal limits bilateral. Strength 5/5 in all groups bilateral.   Radiographic exam: Normal osseous mineralization. Joint spaces preserved. No fracture/dislocation/boney destruction. No other soft tissue abnormalities or radiopaque foreign bodies.   Assessment: 1. plantar fasciitis bilateral feet 2. Corns bilateral fifth toes  Plan of Care:  1. Patient evaluated. Xrays reviewed.   2. Excisional debridement of keratoic lesion using a chisel blade was performed without incident.  3. Dressed with light dressing. 4. Silicone toe caps dispensed.  5. Appointment with [facility] for custom molded orthotics.  6. Prescription for Meloxicam provided to patient.  7. Return to clinic as needed.   CMA for [facility].    Felecia Shelling,  DPM [facility]  Dr. Felecia Shelling, DPM    [address] [place], [place] [address]                Office [phone]  Fax [phone]

## 2017-10-09 ENCOUNTER — Encounter: Payer: Self-pay | Admitting: Family Medicine

## 2017-10-14 ENCOUNTER — Encounter: Payer: Self-pay | Admitting: Family Medicine

## 2017-10-21 ENCOUNTER — Other Ambulatory Visit: Payer: 59 | Admitting: Orthotics

## 2017-10-28 ENCOUNTER — Ambulatory Visit (INDEPENDENT_AMBULATORY_CARE_PROVIDER_SITE_OTHER): Payer: 59 | Admitting: Orthotics

## 2017-10-28 DIAGNOSIS — M2142 Flat foot [pes planus] (acquired), left foot: Secondary | ICD-10-CM

## 2017-10-28 DIAGNOSIS — M2141 Flat foot [pes planus] (acquired), right foot: Secondary | ICD-10-CM

## 2017-10-28 DIAGNOSIS — M722 Plantar fascial fibromatosis: Secondary | ICD-10-CM | POA: Diagnosis not present

## 2017-10-28 NOTE — Progress Notes (Signed)
Patient came in today to pick up custom made foot orthotics.  The goals were accomplished and the patient reported no dissatisfaction with said orthotics.  Patient was advised of breakin period and how to report any issues. 

## 2017-11-18 ENCOUNTER — Other Ambulatory Visit: Payer: 59 | Admitting: Orthotics

## 2017-11-20 ENCOUNTER — Other Ambulatory Visit: Payer: 59 | Admitting: Orthotics

## 2017-12-16 ENCOUNTER — Other Ambulatory Visit: Payer: 59 | Admitting: Orthotics

## 2018-01-07 ENCOUNTER — Other Ambulatory Visit: Payer: Self-pay | Admitting: Obstetrics and Gynecology

## 2018-01-07 ENCOUNTER — Encounter: Payer: Self-pay | Admitting: Obstetrics and Gynecology

## 2018-01-07 MED ORDER — FLUCONAZOLE 150 MG PO TABS: 150.0000 mg | ORAL_TABLET | Freq: Once | ORAL | 0 refills | Status: AC

## 2018-01-07 NOTE — Progress Notes (Unsigned)
Rx diflucan for yeast vag after abx use

## 2018-02-24 ENCOUNTER — Telehealth: Payer: 59 | Admitting: Family

## 2018-02-24 DIAGNOSIS — B9689 Other specified bacterial agents as the cause of diseases classified elsewhere: Secondary | ICD-10-CM | POA: Diagnosis not present

## 2018-02-24 DIAGNOSIS — N760 Acute vaginitis: Secondary | ICD-10-CM

## 2018-02-24 MED ORDER — METRONIDAZOLE 0.75 % VA GEL: 1.0000 | Freq: Every day | VAGINAL | 0 refills | Status: DC

## 2018-02-24 NOTE — Progress Notes (Signed)
We are sorry that you are not feeling well. Here is how we plan to help! Based on what you shared with me it looks like you: May have a vaginosis due to bacteria  Vaginosis is an inflammation of the vagina that can result in discharge, itching and pain. The cause is usually a change in the normal balance of vaginal bacteria or an infection. Vaginosis can also result from reduced estrogen levels after menopause.  The most common causes of vaginosis are:   Bacterial vaginosis which results from an overgrowth of one on several organisms that are normally present in your vagina.   Yeast infections which are caused by a naturally occurring fungus called candida.   Vaginal atrophy (atrophic vaginosis) which results from the thinning of the vagina from reduced estrogen levels after menopause.   Trichomoniasis which is caused by a parasite and is commonly transmitted by sexual intercourse.  Factors that increase your risk of developing vaginosis include: Marland Kitchen Medications, such as antibiotics and steroids . Uncontrolled diabetes . Use of hygiene products such as bubble bath, vaginal spray or vaginal deodorant . Douching . Wearing damp or tight-fitting clothing . Using an intrauterine device (IUD) for birth control . Hormonal changes, such as those associated with pregnancy, birth control pills or menopause . Sexual activity . Having a sexually transmitted infection  Your treatment plan is Metrogel vaginal cream applied vaginally for 5 days.  I have electronically sent this prescription into the pharmacy that you have chosen.  Be sure to take all of the medication as directed. Stop taking any medication if you develop a rash, tongue swelling or shortness of breath. Mothers who are breast feeding should consider pumping and discarding their breast milk while on these antibiotics. However, there is no consensus that infant exposure at these doses would be harmful.  Remember that medication creams can  weaken latex condoms. Marland Kitchen   HOME CARE:  Good hygiene may prevent some types of vaginosis from recurring and may relieve some symptoms:  . Avoid baths, hot tubs and whirlpool spas. Rinse soap from your outer genital area after a shower, and dry the area well to prevent irritation. Don't use scented or harsh soaps, such as those with deodorant or antibacterial action. Marland Kitchen Avoid irritants. These include scented tampons and pads. . Wipe from front to back after using the toilet. Doing so avoids spreading fecal bacteria to your vagina.  Other things that may help prevent vaginosis include:  Marland Kitchen Don't douche. Your vagina doesn't require cleansing other than normal bathing. Repetitive douching disrupts the normal organisms that reside in the vagina and can actually increase your risk of vaginal infection. Douching won't clear up a vaginal infection. . Use a latex condom. Both female and female latex condoms may help you avoid infections spread by sexual contact. . Wear cotton underwear. Also wear pantyhose with a cotton crotch. If you feel comfortable without it, skip wearing underwear to bed. Yeast thrives in [employer] Your symptoms should improve in the next day or two.  GET HELP RIGHT AWAY IF:  . You have pain in your lower abdomen ( pelvic area or over your ovaries) . You develop nausea or vomiting . You develop a fever . Your discharge changes or worsens . You have persistent pain with intercourse . You develop shortness of breath, a rapid pulse, or you faint.  These symptoms could be signs of problems or infections that need to be evaluated by a medical provider now.  MAKE SURE  YOU    Understand these instructions.  Will watch your condition.  Will get help right away if you are not doing well or get worse.  Your e-visit answers were reviewed by a board certified advanced clinical practitioner to complete your personal care plan. Depending upon the condition, your plan could  have included both over the counter or prescription medications. Please review your pharmacy choice to make sure that you have choses a pharmacy that is open for you to pick up any needed prescription, Your safety is important to [place]. If you have drug allergies check your prescription carefully.   You can use MyChart to ask questions about today's visit, request a non-urgent call back, or ask for a work or school excuse for 24 hours related to this e-Visit. If it has been greater than 24 hours you will need to follow up with your provider, or enter a new e-Visit to address those concerns. You will get a MyChart message within the next two days asking about your experience. I hope that your e-visit has been valuable and will speed your recovery.

## 2018-12-06 ENCOUNTER — Encounter: Payer: Self-pay | Admitting: Family Medicine

## 2018-12-22 ENCOUNTER — Other Ambulatory Visit: Payer: Self-pay

## 2018-12-22 ENCOUNTER — Encounter: Payer: Self-pay | Admitting: Family Medicine

## 2018-12-22 ENCOUNTER — Ambulatory Visit (INDEPENDENT_AMBULATORY_CARE_PROVIDER_SITE_OTHER): Payer: 59 | Admitting: Family Medicine

## 2018-12-22 ENCOUNTER — Other Ambulatory Visit: Payer: Self-pay | Admitting: Family Medicine

## 2018-12-22 VITALS — BP 121/81 | HR 76 | Temp 98.7°F | Ht 61.25 in | Wt 165.6 lb

## 2018-12-22 DIAGNOSIS — Z23 Encounter for immunization: Secondary | ICD-10-CM | POA: Diagnosis not present

## 2018-12-22 DIAGNOSIS — Z0000 Encounter for general adult medical examination without abnormal findings: Secondary | ICD-10-CM | POA: Diagnosis not present

## 2018-12-22 DIAGNOSIS — Z113 Encounter for screening for infections with a predominantly sexual mode of transmission: Secondary | ICD-10-CM

## 2018-12-22 LAB — CBC WITH DIFFERENTIAL/PLATELET
Hematocrit: 31.8 % — ABNORMAL LOW (ref 34.0–46.6)
Hemoglobin: 10 g/dL — ABNORMAL LOW (ref 11.1–15.9)
Lymphocytes Absolute: 2 10*3/uL (ref 0.7–3.1)
Lymphs: 38 %
MCH: 25.2 pg — ABNORMAL LOW (ref 26.6–33.0)
MCHC: 31.4 g/dL — ABNORMAL LOW (ref 31.5–35.7)
MCV: 80 fL (ref 79–97)
MID (Absolute): 0.4 10*3/uL (ref 0.1–1.6)
MID: 7 %
Neutrophils Absolute: 2.8 10*3/uL (ref 1.4–7.0)
Neutrophils: 55 %
Platelets: 333 10*3/uL (ref 150–450)
RBC: 3.97 x10E6/uL (ref 3.77–5.28)
RDW: 19.6 % — ABNORMAL HIGH (ref 11.7–15.4)
WBC: 5.2 10*3/uL (ref 3.4–10.8)

## 2018-12-22 LAB — UA/M W/RFLX CULTURE, ROUTINE
Bilirubin, UA: NEGATIVE
Glucose, UA: NEGATIVE
Ketones, UA: NEGATIVE
Leukocytes,UA: NEGATIVE
Nitrite, UA: NEGATIVE
Specific Gravity, UA: <1.005 — ABNORMAL LOW (ref 1.005–1.030)
Urobilinogen, Ur: 0.2 mg/dL (ref 0.2–1.0)
pH, UA: 6 (ref 5.0–7.5)

## 2018-12-22 LAB — MICROSCOPIC EXAMINATION
Bacteria, UA: NONE SEEN
WBC, UA: NONE SEEN /hpf (ref 0–5)

## 2018-12-22 NOTE — Patient Instructions (Signed)
Td Vaccine (Tetanus and Diphtheria): What You Need to Know 1. Why get vaccinated? Tetanus  and diphtheria are very serious diseases. They are rare in the [place] today, but people who do become infected often have severe complications. Td vaccine is used to protect adolescents and adults from both of these diseases. Both tetanus and diphtheria are infections caused by bacteria. Diphtheria spreads from person to person through coughing or sneezing. Tetanus-causing bacteria enter the body through cuts, scratches, or wounds. TETANUS (Lockjaw) causes painful muscle tightening and stiffness, usually all over the body.  It can lead to tightening of muscles in the head and neck so you can't open your mouth, swallow, or sometimes even breathe. Tetanus kills about 1 out of every 10 people who are infected even after receiving the best medical care. DIPHTHERIA can cause a thick coating to form in the back of the throat.  It can lead to breathing problems, paralysis, heart failure, and death. Before vaccines, as many as 200,000 cases of diphtheria and hundreds of cases of tetanus were reported in the [place] each year. Since vaccination began, reports of cases for both diseases have dropped by about 99%. 2. Td vaccine Td vaccine can protect adolescents and adults from tetanus and diphtheria. Td is usually given as a booster dose every 10 years but it can also be given earlier after a severe and dirty wound or burn. Another vaccine, called Tdap, which protects against pertussis in addition to tetanus and diphtheria, is sometimes recommended instead of Td vaccine. Your doctor or the person giving you the vaccine can give you more information. Td may safely be given at the same time as other vaccines. 3. Some people should not get this vaccine  A person who has ever had a life-threatening allergic reaction after a previous dose of any tetanus or diphtheria containing vaccine, OR has a severe allergy  to any part of this vaccine, should not get Td vaccine. Tell the person giving the vaccine about any severe allergies.  Talk to your doctor if you: ? had severe pain or swelling after any vaccine containing diphtheria or tetanus, ? ever had a condition called Guillain Barr Syndrome (GBS), ? aren't feeling well on the day the shot is scheduled. 4. Risks of a vaccine reaction With any medicine, including vaccines, there is a chance of side effects. These are usually mild and go away on their own. Serious reactions are also possible but are rare. Most people who get Td vaccine do not have any problems with it. Mild Problems following Td vaccine: (Did not interfere with activities)  Pain where the shot was given (about 8 people in 10)  Redness or swelling where the shot was given (about 1 person in 4)  Mild fever (rare)  Headache (about 1 person in 4)  Tiredness (about 1 person in 4) Moderate Problems following Td vaccine: (Interfered with activities, but did not require medical attention)  Fever over 102F (rare) Severe Problems following Td vaccine: (Unable to perform usual activities; required medical attention)  Swelling, severe pain, bleeding and/or redness in the arm where the shot was given (rare). Problems that could happen after any vaccine:  People sometimes faint after a medical procedure, including vaccination. Sitting or lying down for about 15 minutes can help prevent fainting, and injuries caused by a fall. Tell your doctor if you feel dizzy, or have vision changes or ringing in the ears.  Some people get severe pain in the shoulder and have   difficulty moving the arm where a shot was given. This happens very rarely.  Any medication can cause a severe allergic reaction. Such reactions from a vaccine are very rare, estimated at fewer than 1 in a million doses, and would happen within a few minutes to a few hours after the vaccination. As with any medicine, there is a  very remote chance of a vaccine causing a serious injury or death. The safety of vaccines is always being monitored. For more information, visit: www.cdc.gov/vaccinesafety/ 5. What if there is a serious reaction? What should I look for?  Look for anything that concerns you, such as signs of a severe allergic reaction, very high fever, or unusual behavior. Signs of a severe allergic reaction can include hives, swelling of the face and throat, difficulty breathing, a fast heartbeat, dizziness, and weakness. These would usually start a few minutes to a few hours after the vaccination. What should I do?  If you think it is a severe allergic reaction or other emergency that can't wait, call 9-1-1 or get the person to the nearest hospital. Otherwise, call your doctor.  Afterward, the reaction should be reported to the Vaccine Adverse Event Reporting System (VAERS). Your doctor might file this report, or you can do it yourself through the VAERS web site at www.vaers.hhs.gov, or by calling 1-800-822-7967. VAERS does not give medical advice. 6. The National Vaccine Injury Compensation Program The National Vaccine Injury Compensation Program (VICP) is a federal program that was created to compensate people who may have been injured by certain vaccines. Persons who believe they may have been injured by a vaccine can learn about the program and about filing a claim by calling 1-800-338-2382 or visiting the VICP website at www.hrsa.gov/vaccinecompensation. There is a time limit to file a claim for compensation. 7. How can I learn more?  Ask your doctor. He or she can give you the vaccine package insert or suggest other sources of information.  Call your local or state health department.  Contact the Centers for Disease Control and Prevention (CDC): ? Call 1-800-232-4636 (1-800-CDC-INFO) ? Visit CDC's website at www.cdc.gov/vaccines Vaccine Information Statement Td Vaccine (09/04/15) This information is  not intended to replace advice given to you by your health care provider. Make sure you discuss any questions you have with your health care provider. Document Released: 03/09/2006 Document Revised: 12/28/2017 Document Reviewed: 12/28/2017 Elsevier Interactive Patient Education  2020 Elsevier Inc.  

## 2018-12-22 NOTE — Addendum Note (Signed)
Addended by: Merrie Roof E on: 12/22/2018 02:48 PM   Modules accepted: Orders

## 2018-12-22 NOTE — Progress Notes (Signed)
BP 121/81 (BP Location: Left Arm, Patient Position: Sitting, Cuff Size: Normal)   Pulse 76   Temp 98.7 F (37.1 C) (Oral)   Ht 5' 1.25" (1.556 m)   Wt 165 lb 9.6 oz (75.1 kg)   SpO2 100%   BMI 31.03 kg/m    Subjective:    Patient ID: Karen Walton, female    DOB: [birth year], 33 y.o.   MRN: [id number]  HPI: Karen Walton is a 33 y.o. female presenting on 12/22/2018 for comprehensive medical examination. Current medical complaints include:none  Has a new partner the past year, requesting STI testing. Asymptomatic, no known exposures.   She currently lives with: Menopausal Symptoms: no  Depression Screen done today and results listed below:  [facility] 12/22/2018 08/26/2017  Decreased Interest 0 0  Down, Depressed, Hopeless 0 0  PHQ - 2 Score 0 0  Altered sleeping 1 -  Tired, decreased energy 1 -  Change in appetite 1 -  Feeling bad or failure about yourself  1 -  Trouble concentrating 1 -  Moving slowly or fidgety/restless 0 -  Suicidal thoughts 0 -  PHQ-9 Score 5 -    The patient does not have a history of falls. I did not complete a risk assessment for falls. A plan of care for falls was not documented.   Past Medical History:  Past Medical History:  Diagnosis Date  . Allergy     Surgical History:  Past Surgical History:  Procedure Laterality Date  . CHOLECYSTECTOMY  2014  . TUBAL LIGATION      Medications:  No current outpatient medications on file prior to visit.   No current facility-administered medications on file prior to visit.     Allergies:  Allergies  Allergen Reactions  . Flagyl [Metronidazole] Nausea And Vomiting    Social History:  Social History   Socioeconomic History  . Marital status: Single    Spouse name: Not on file  . Number of children: Not on file  . Years of education: Not on file  . Highest education level: Not on file  Occupational History  . Not on file  Social Needs  . Financial resource  strain: Not on file  . Food insecurity    Worry: Not on file    Inability: Not on file  . Transportation needs    Medical: Not on file    Non-medical: Not on file  Tobacco Use  . Smoking status: Never Smoker  . Smokeless tobacco: Never Used  Substance and Sexual Activity  . Alcohol use: Yes    Alcohol/week: 2.0 standard drinks    Types: 2 Glasses of wine per week    Comment: Socially  . Drug use: No  . Sexual activity: Yes    Birth control/protection: None, Surgical  Lifestyle  . Physical activity    Days per week: Not on file    Minutes per session: Not on file  . Stress: Not on file  Relationships  . Social Musicianconnections    Talks on phone: Not on file    Gets together: Not on file    Attends religious service: Not on file    Active member of club or organization: Not on file    Attends meetings of clubs or organizations: Not on file    Relationship status: Not on file  . Intimate partner violence    Fear of current or ex partner: Not on file    Emotionally abused: Not  on file    Physically abused: Not on file    Forced sexual activity: Not on file  Other Topics Concern  . Not on file  Social History Narrative  . Not on file   Social History   Tobacco Use  Smoking Status Never Smoker  Smokeless Tobacco Never Used   Social History   Substance and Sexual Activity  Alcohol Use Yes  . Alcohol/week: 2.0 standard drinks  . Types: 2 Glasses of wine per week   Comment: Socially    Family History:  Family History  Problem Relation Age of Onset  . Cancer Paternal Grandfather        brain  . Diabetes Mother   . Hypertension Mother   . Hyperlipidemia Sister   . Hypertension Sister   . Depression Maternal Grandmother   . Osteoporosis Maternal Grandmother   . Brain cancer Maternal Grandfather   . Diabetes Paternal Grandmother   . COPD Paternal Grandmother   . Congestive Heart Failure Paternal Grandmother   . Thyroid disease Neg Hx     Past medical history,  surgical history, medications, allergies, family history and social history reviewed with patient today and changes made to appropriate areas of the chart.   Review of Systems - General ROS: negative Psychological ROS: negative Ophthalmic ROS: negative ENT ROS: negative Allergy and Immunology ROS: negative Hematological and Lymphatic ROS: negative Endocrine ROS: negative Breast ROS: negative for breast lumps Respiratory ROS: no cough, shortness of breath, or wheezing Cardiovascular ROS: no chest pain or dyspnea on exertion Gastrointestinal ROS: no abdominal pain, change in bowel habits, or black or bloody stools Genito-Urinary ROS: no dysuria, trouble voiding, or hematuria Musculoskeletal ROS: negative Neurological ROS: no TIA or stroke symptoms Dermatological ROS: negative All other ROS negative except what is listed above and in the HPI.      Objective:    BP 121/81 (BP Location: Left Arm, Patient Position: Sitting, Cuff Size: Normal)   Pulse 76   Temp 98.7 F (37.1 C) (Oral)   Ht 5' 1.25" (1.556 m)   Wt 165 lb 9.6 oz (75.1 kg)   SpO2 100%   BMI 31.03 kg/m   Wt Readings from Last 3 Encounters:  12/22/18 165 lb 9.6 oz (75.1 kg)  08/24/17 158 lb 8 oz (71.9 kg)  08/04/17 163 lb (73.9 kg)    Physical Exam Vitals signs and nursing note reviewed.  Constitutional:      General: She is not in acute distress.    Appearance: She is well-developed.  HENT:     Head: Atraumatic.     Right Ear: External ear normal.     Left Ear: External ear normal.     Nose: Nose normal.     Mouth/Throat:     Pharynx: No oropharyngeal exudate.  Eyes:     General: No scleral icterus.    Conjunctiva/sclera: Conjunctivae normal.     Pupils: Pupils are equal, round, and reactive to light.  Neck:     Musculoskeletal: Normal range of motion and neck supple.     Thyroid: No thyromegaly.  Cardiovascular:     Rate and Rhythm: Normal rate and regular rhythm.     Heart sounds: Normal heart sounds.   Pulmonary:     Effort: Pulmonary effort is normal. No respiratory distress.     Breath sounds: Normal breath sounds.  Abdominal:     General: Bowel sounds are normal.     Palpations: Abdomen is soft. There is no mass.  Tenderness: There is no abdominal tenderness.  Genitourinary:    Comments: GU exam deferred Musculoskeletal: Normal range of motion.        General: No tenderness.  Lymphadenopathy:     Cervical: No cervical adenopathy.  Skin:    General: Skin is warm and dry.     Findings: No rash.  Neurological:     Mental Status: She is alert and oriented to person, place, and time.     Cranial Nerves: No cranial nerve deficit.  Psychiatric:        Behavior: Behavior normal.     Results for orders placed or performed in visit on 09/07/17  CBC with Differential/Platelet  Result Value Ref Range   WBC 4.4 3.4 - 10.8 x10E3/uL   RBC 3.49 (L) 3.77 - 5.28 x10E6/uL   Hemoglobin 10.4 (L) 11.1 - 15.9 g/dL   Hematocrit 16.131.5 (L) 09.634.0 - 46.6 %   MCV 90 79 - 97 fL   MCH 29.8 26.6 - 33.0 pg   MCHC 33.0 31.5 - 35.7 g/dL   RDW 04.515.4 40.912.3 - 81.115.4 %   Platelets 261 150 - 379 x10E3/uL   Neutrophils 55 Not Estab. %   Lymphs 37 Not Estab. %   Monocytes 6 Not Estab. %   Eos 2 Not Estab. %   Basos 0 Not Estab. %   Neutrophils Absolute 2.4 1.4 - 7.0 x10E3/uL   Lymphocytes Absolute 1.6 0.7 - 3.1 x10E3/uL   Monocytes Absolute 0.3 0.1 - 0.9 x10E3/uL   EOS (ABSOLUTE) 0.1 0.0 - 0.4 x10E3/uL   Basophils Absolute 0.0 0.0 - 0.2 x10E3/uL   Immature Granulocytes 0 Not Estab. %   Immature Grans (Abs) 0.0 0.0 - 0.1 x10E3/uL  Comprehensive metabolic panel  Result Value Ref Range   Glucose 92 65 - 99 mg/dL   BUN 12 6 - 20 mg/dL   Creatinine, Ser 9.140.64 0.57 - 1.00 mg/dL   GFR calc non Af Amer 119 >59 mL/min/1.73   GFR calc Af Amer 138 >59 mL/min/1.73   BUN/Creatinine Ratio 19 9 - 23   Sodium 143 134 - 144 mmol/L   Potassium 4.3 3.5 - 5.2 mmol/L   Chloride 109 (H) 96 - 106 mmol/L   CO2 20 20 - 29  mmol/L   Calcium 8.5 (L) 8.7 - 10.2 mg/dL   Total Protein 6.4 6.0 - 8.5 g/dL   Albumin 3.8 3.5 - 5.5 g/dL   Globulin, Total 2.6 1.5 - 4.5 g/dL   Albumin/Globulin Ratio 1.5 1.2 - 2.2   Bilirubin Total 0.5 0.0 - 1.2 mg/dL   Alkaline Phosphatase 52 39 - 117 IU/L   AST 27 0 - 40 IU/L   ALT 15 0 - 32 IU/L  Lipid Panel w/o Chol/HDL Ratio  Result Value Ref Range   Cholesterol, Total 164 100 - 199 mg/dL   Triglycerides 77 0 - 149 mg/dL   HDL 58 >78>39 mg/dL   VLDL Cholesterol Cal 15 5 - 40 mg/dL   LDL Calculated 91 0 - 99 mg/dL  HSV(herpes simplex vrs) 1+2 ab-IgG  Result Value Ref Range   HSV 1 Glycoprotein G Ab, IgG 53.40 (H) 0.00 - 0.90 index   HSV 2 IgG, Type Spec <0.91 0.00 - 0.90 index  RPR  Result Value Ref Range   RPR Ser Ql Non Reactive Non Reactive  HIV antibody  Result Value Ref Range   HIV Screen 4th Generation wRfx Non Reactive Non Reactive  TSH  Result Value Ref Range  TSH 1.300 0.450 - 4.500 uIU/mL      Assessment & Plan:   Problem List Items Addressed This Visit    None    Visit Diagnoses    Annual physical exam    -  Primary   Relevant Orders   CBC with Differential/Platelet   Comprehensive metabolic panel   Lipid Panel w/o Chol/HDL Ratio   TSH   UA/M w/rflx Culture, Routine   Need for Td vaccine       Relevant Orders   Td vaccine greater than or equal to 7yo preservative free IM (Completed)   Routine screening for STI (sexually transmitted infection)       Relevant Orders   HIV Antibody (routine testing w rflx)   RPR   HSV(herpes simplex vrs) 1+2 ab-IgG   GC/Chlamydia Probe Amp       Follow up plan: Return in about 1 year (around 12/22/2019) for CPE.   LABORATORY TESTING:  - Pap smear: up to date  IMMUNIZATIONS:   - Tdap: Tetanus vaccination status reviewed: Td vaccination indicated and given today. - Influenza: Postponed to flu season  PATIENT COUNSELING:   Advised to take 1 mg of folate supplement per day if capable of pregnancy.    Sexuality: Discussed sexually transmitted diseases, partner selection, use of condoms, avoidance of unintended pregnancy  and contraceptive alternatives.   Advised to avoid cigarette smoking.  I discussed with the patient that most people either abstain from alcohol or drink within safe limits (<=14/week and <=4 drinks/occasion for males, <=7/weeks and <= 3 drinks/occasion for females) and that the risk for alcohol disorders and other health effects rises proportionally with the number of drinks per week and how often a drinker exceeds daily limits.  Discussed cessation/primary prevention of drug use and availability of treatment for abuse.   Diet: Encouraged to adjust caloric intake to maintain  or achieve ideal body weight, to reduce intake of dietary saturated fat and total fat, to limit sodium intake by avoiding high sodium foods and not adding table salt, and to maintain adequate dietary potassium and calcium preferably from fresh fruits, vegetables, and low-fat dairy products.    stressed the importance of regular exercise  Injury prevention: Discussed safety belts, safety helmets, smoke detector, smoking near bedding or upholstery.   Dental health: Discussed importance of regular tooth brushing, flossing, and dental visits.    NEXT PREVENTATIVE PHYSICAL DUE IN 1 YEAR. Return in about 1 year (around 12/22/2019) for CPE.

## 2018-12-23 ENCOUNTER — Encounter: Payer: Self-pay | Admitting: Family Medicine

## 2018-12-24 LAB — COMPREHENSIVE METABOLIC PANEL
ALT: 18 IU/L (ref 0–32)
AST: 28 IU/L (ref 0–40)
Albumin/Globulin Ratio: 1.2 (ref 1.2–2.2)
Albumin: 4.1 g/dL (ref 3.8–4.8)
Alkaline Phosphatase: 60 IU/L (ref 39–117)
BUN/Creatinine Ratio: 13 (ref 9–23)
BUN: 8 mg/dL (ref 6–20)
Bilirubin Total: 0.5 mg/dL (ref 0.0–1.2)
CO2: 20 mmol/L (ref 20–29)
Calcium: 8.6 mg/dL — ABNORMAL LOW (ref 8.7–10.2)
Chloride: 102 mmol/L (ref 96–106)
Creatinine, Ser: 0.63 mg/dL (ref 0.57–1.00)
GFR calc Af Amer: 137 mL/min/{1.73_m2} (ref >59)
GFR calc non Af Amer: 119 mL/min/{1.73_m2} (ref >59)
Globulin, Total: 3.3 g/dL (ref 1.5–4.5)
Glucose: 74 mg/dL (ref 65–99)
Potassium: 3.9 mmol/L (ref 3.5–5.2)
Sodium: 137 mmol/L (ref 134–144)
Total Protein: 7.4 g/dL (ref 6.0–8.5)

## 2018-12-24 LAB — LIPID PANEL W/O CHOL/HDL RATIO
Cholesterol, Total: 194 mg/dL (ref 100–199)
HDL: 66 mg/dL (ref >39)
LDL Calculated: 112 mg/dL — ABNORMAL HIGH (ref 0–99)
Triglycerides: 81 mg/dL (ref 0–149)
VLDL Cholesterol Cal: 16 mg/dL (ref 5–40)

## 2018-12-24 LAB — TSH: TSH: 1.12 u[IU]/mL (ref 0.450–4.500)

## 2018-12-24 LAB — HIV ANTIBODY (ROUTINE TESTING W REFLEX): HIV Screen 4th Generation wRfx: NONREACTIVE

## 2018-12-24 LAB — RPR: RPR Ser Ql: NONREACTIVE

## 2018-12-24 LAB — HSV(HERPES SIMPLEX VRS) I + II AB-IGG
HSV 1 Glycoprotein G Ab, IgG: 61.2 index — ABNORMAL HIGH (ref 0.00–0.90)
HSV 2 IgG, Type Spec: <0.91 index (ref 0.00–0.90)

## 2018-12-28 LAB — GC/CHLAMYDIA PROBE AMP
Chlamydia trachomatis, NAA: NEGATIVE
Neisseria Gonorrhoeae by PCR: NEGATIVE

## 2019-02-09 ENCOUNTER — Other Ambulatory Visit: Payer: Self-pay | Admitting: *Deleted

## 2019-02-09 DIAGNOSIS — Z20822 Contact with and (suspected) exposure to covid-19: Secondary | ICD-10-CM

## 2019-02-09 DIAGNOSIS — R6889 Other general symptoms and signs: Secondary | ICD-10-CM | POA: Diagnosis not present

## 2019-02-10 LAB — NOVEL CORONAVIRUS, NAA: SARS-CoV-2, NAA: NOT DETECTED

## 2019-03-25 ENCOUNTER — Other Ambulatory Visit: Payer: Self-pay

## 2019-03-25 ENCOUNTER — Ambulatory Visit (INDEPENDENT_AMBULATORY_CARE_PROVIDER_SITE_OTHER)
Admission: RE | Admit: 2019-03-25 | Discharge: 2019-03-25 | Disposition: A | Discharge status: Home / Self Care | Payer: 59 | Source: Ambulatory Visit

## 2019-03-25 ENCOUNTER — Other Ambulatory Visit: Payer: Self-pay | Admitting: *Deleted

## 2019-03-25 DIAGNOSIS — J069 Acute upper respiratory infection, unspecified: Secondary | ICD-10-CM

## 2019-03-25 DIAGNOSIS — Z20822 Contact with and (suspected) exposure to covid-19: Secondary | ICD-10-CM

## 2019-03-25 NOTE — Discharge Instructions (Addendum)
As discussed, cannot rule out COVID. Currently, no alarming signs. Follow up as scheduled by health at work for testing. I would like you to quarantine until testing results. You can take over the counter flonase/nasacort to help with nasal congestion/drainage. Can also use afrin for for nasal congestion, as discussed, cannot use for more than 3 days as it can cause rebound congestion. If experiencing shortness of breath, trouble breathing, go to the emergency department for further evaluation needed.

## 2019-03-25 NOTE — ED Provider Notes (Signed)
Virtual Visit via Video Note:  Karen Walton  initiated request for Telemedicine visit with [facility] Urgent Care team. I connected with Karen Walton  on 03/25/2019 at 9:01 AM  for a synchronized telemedicine visit using a video enabled HIPPA compliant telemedicine application. I verified that I am speaking with Karen Walton  using two identifiers. Karen Edwards, Karen Walton  was physically located in a [facility] Urgent care site and Karen Walton was located at a different location.   The limitations of evaluation and management by telemedicine as well as the availability of in-person appointments were discussed. Patient was informed that she  may incur a bill ( including co-pay) for this virtual visit encounter. Karen Walton  expressed understanding and gave verbal consent to proceed with virtual visit.     History of Present Illness:Karen Walton  is a 33 y.o. female presents with 2-day history of URI symptoms.  Has a nasal congestion, cough, sore throat, sinus pressure.  States cough can be productive at times.  Denies shortness of breath, wheezing.  T-max 100.1 with chills, no body aches.  Denies abdominal pain, nausea, vomiting, diarrhea.  Denies loss of taste or smell.  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  Works at [employer], has already contacted health at work, and will have [place] testing later today.  Never smoker.  OTC cold medication with some relief.   Past Medical History:  Diagnosis Date  . Allergy     Allergies  Allergen Reactions  . Flagyl [Metronidazole] Nausea And Vomiting        Observations/Objective: General: Well appearing, nontoxic, no acute distress. Sitting comfortably.  Head: Normocephalic, atraumatic Eye: No conjunctival injection, eyelid swelling. EOMI ENT: Mucus membranes moist, no lip cracking. No obvious nasal drainage. Pulm: Speaking in full sentences without difficulty. Able to walk and speak without  problems. Normal effort. No respiratory distress, accessory muscle use. Neuro: Normal mental status. Alert and oriented x 3.   Assessment and Plan: COVID testing as per health at work. Patient to remain in quarantine until testing results return. Symptomatic treatment discussed. Return precautions given. Patient expresses understanding and agrees to plan.  Follow Up Instructions:    I discussed the assessment and treatment plan with the patient. The patient was provided an opportunity to ask questions and all were answered. The patient agreed with the plan and demonstrated an understanding of the instructions.   The patient was advised to call back or seek an in-person evaluation if the symptoms worsen or if the condition fails to improve as anticipated.  I provided 15 minutes of non-face-to-face time during this encounter.    Karen Edwards, Karen Walton  03/25/2019 9:01 AM         Karen Edwards, Karen Walton 03/25/19 [phone]

## 2019-12-30 ENCOUNTER — Other Ambulatory Visit: Payer: Self-pay

## 2019-12-30 ENCOUNTER — Encounter: Payer: Self-pay | Admitting: Family Medicine

## 2019-12-30 ENCOUNTER — Ambulatory Visit (INDEPENDENT_AMBULATORY_CARE_PROVIDER_SITE_OTHER): Payer: Medicaid Other | Admitting: Family Medicine

## 2019-12-30 VITALS — BP 111/77 | HR 91 | Temp 98.4°F | Ht 61.5 in | Wt 172.0 lb

## 2019-12-30 DIAGNOSIS — Z113 Encounter for screening for infections with a predominantly sexual mode of transmission: Secondary | ICD-10-CM

## 2019-12-30 DIAGNOSIS — Z0000 Encounter for general adult medical examination without abnormal findings: Secondary | ICD-10-CM

## 2019-12-30 DIAGNOSIS — Z1159 Encounter for screening for other viral diseases: Secondary | ICD-10-CM

## 2019-12-30 DIAGNOSIS — Z136 Encounter for screening for cardiovascular disorders: Secondary | ICD-10-CM | POA: Diagnosis not present

## 2019-12-30 NOTE — Progress Notes (Signed)
BP 111/77   Pulse 91   Temp 98.4 F (36.9 C) (Oral)   Ht 5' 1.5" (1.562 m)   Wt 172 lb (78 kg)   SpO2 99%   BMI 31.97 kg/m    Subjective:    Patient ID: Karen Walton, female    DOB: [birth year], 34 y.o.   MRN: [id number]  HPI: Karen Walton is a 34 y.o. female presenting on 12/30/2019 for comprehensive medical examination. Current medical complaints include:none  She currently lives with: Menopausal Symptoms: no  Depression Screen done today and results listed below:  Depression screen [facility] 2/9 12/22/2018 08/26/2017  Decreased Interest 0 0  Down, Depressed, Hopeless 0 0  PHQ - 2 Score 0 0  Altered sleeping 1 -  Tired, decreased energy 1 -  Change in appetite 1 -  Feeling bad or failure about yourself  1 -  Trouble concentrating 1 -  Moving slowly or fidgety/restless 0 -  Suicidal thoughts 0 -  PHQ-9 Score 5 -    The patient does not have a history of falls. I did complete a risk assessment for falls. A plan of care for falls was documented.   Past Medical History:  Past Medical History:  Diagnosis Date  . Allergy     Surgical History:  Past Surgical History:  Procedure Laterality Date  . CHOLECYSTECTOMY  2014  . TUBAL LIGATION      Medications:  No current outpatient medications on file prior to visit.   No current facility-administered medications on file prior to visit.    Allergies:  Allergies  Allergen Reactions  . Flagyl [Metronidazole] Nausea And Vomiting    Social History:  Social History   Socioeconomic History  . Marital status: Single    Spouse name: Not on file  . Number of children: Not on file  . Years of education: Not on file  . Highest education level: Not on file  Occupational History  . Not on file  Tobacco Use  . Smoking status: Never Smoker  . Smokeless tobacco: Never Used  Vaping Use  . Vaping Use: Never used  Substance and Sexual Activity  . Alcohol use: Yes    Alcohol/week: 2.0 standard drinks    Types: 2  Glasses of wine per week    Comment: Socially  . Drug use: No  . Sexual activity: Yes    Birth control/protection: None, Surgical  Other Topics Concern  . Not on file  Social History Narrative  . Not on file   Social Determinants of Health   Financial Resource Strain:   . Difficulty of Paying Living Expenses:   Food Insecurity:   . Worried About Programme researcher, broadcasting/film/video in the Last Year:   . Barista in the Last Year:   Transportation Needs:   . Freight forwarder (Medical):   Marland Kitchen Lack of Transportation (Non-Medical):   Physical Activity:   . Days of Exercise per Week:   . Minutes of Exercise per Session:   Stress:   . Feeling of Stress :   Social Connections:   . Frequency of Communication with Friends and Family:   . Frequency of Social Gatherings with Friends and Family:   . Attends Religious Services:   . Active Member of Clubs or Organizations:   . Attends Banker Meetings:   Marland Kitchen Marital Status:   Intimate Partner Violence:   . Fear of Current or Ex-Partner:   . Emotionally Abused:   .  Physically Abused:   . Sexually Abused:    Social History   Tobacco Use  Smoking Status Never Smoker  Smokeless Tobacco Never Used   Social History   Substance and Sexual Activity  Alcohol Use Yes  . Alcohol/week: 2.0 standard drinks  . Types: 2 Glasses of wine per week   Comment: Socially    Family History:  Family History  Problem Relation Age of Onset  . Cancer Paternal Grandfather        brain  . Diabetes Mother   . Hypertension Mother   . Hyperlipidemia Sister   . Hypertension Sister   . Depression Maternal Grandmother   . Osteoporosis Maternal Grandmother   . Brain cancer Maternal Grandfather   . Diabetes Paternal Grandmother   . COPD Paternal Grandmother   . Congestive Heart Failure Paternal Grandmother   . Thyroid disease Neg Hx     Past medical history, surgical history, medications, allergies, family history and social history reviewed  with patient today and changes made to appropriate areas of the chart.   Review of Systems - General ROS: negative Psychological ROS: negative Ophthalmic ROS: negative ENT ROS: negative Allergy and Immunology ROS: negative Hematological and Lymphatic ROS: negative Endocrine ROS: negative Breast ROS: negative for breast lumps Respiratory ROS: no cough, shortness of breath, or wheezing Cardiovascular ROS: no chest pain or dyspnea on exertion Gastrointestinal ROS: no abdominal pain, change in bowel habits, or black or bloody stools Genito-Urinary ROS: no dysuria, trouble voiding, or hematuria Musculoskeletal ROS: negative Neurological ROS: no TIA or stroke symptoms Dermatological ROS: negative All other ROS negative except what is listed above and in the HPI.      Objective:    BP 111/77   Pulse 91   Temp 98.4 F (36.9 C) (Oral)   Ht 5' 1.5" (1.562 m)   Wt 172 lb (78 kg)   SpO2 99%   BMI 31.97 kg/m   Wt Readings from Last 3 Encounters:  12/30/19 172 lb (78 kg)  12/22/18 165 lb 9.6 oz (75.1 kg)  08/24/17 158 lb 8 oz (71.9 kg)    Physical Exam Vitals and nursing note reviewed.  Constitutional:      General: She is not in acute distress.    Appearance: She is well-developed.  HENT:     Head: Atraumatic.     Right Ear: External ear normal.     Left Ear: External ear normal.     Nose: Nose normal.     Mouth/Throat:     Pharynx: No oropharyngeal exudate.  Eyes:     General: No scleral icterus.    Conjunctiva/sclera: Conjunctivae normal.     Pupils: Pupils are equal, round, and reactive to light.  Neck:     Thyroid: No thyromegaly.  Cardiovascular:     Rate and Rhythm: Normal rate and regular rhythm.     Heart sounds: Normal heart sounds.  Pulmonary:     Effort: Pulmonary effort is normal. No respiratory distress.     Breath sounds: Normal breath sounds.  Chest:     Breasts:        Right: No mass, skin change or tenderness.        Left: No mass, skin change or  tenderness.  Abdominal:     General: Bowel sounds are normal.     Palpations: Abdomen is soft. There is no mass.     Tenderness: There is no abdominal tenderness.  Genitourinary:    Comments: GU exam deferred with  shared decision making Musculoskeletal:        General: No tenderness. Normal range of motion.     Cervical back: Normal range of motion and neck supple.  Lymphadenopathy:     Cervical: No cervical adenopathy.     Upper Body:     Right upper body: No axillary adenopathy.     Left upper body: No axillary adenopathy.  Skin:    General: Skin is warm and dry.     Findings: No rash.  Neurological:     Mental Status: She is alert and oriented to person, place, and time.     Cranial Nerves: No cranial nerve deficit.  Psychiatric:        Behavior: Behavior normal.    Results for orders placed or performed in visit on 02/09/19  Novel Coronavirus, NAA ([facility])   Specimen: Oropharyngeal(OP) collection in vial transport medium   OROPHARYNGEA  TESTING  Result Value Ref Range   SARS-CoV-2, NAA Not Detected Not Detected      Assessment & Plan:   Problem List Items Addressed This Visit    None    Visit Diagnoses    Annual physical exam    -  Primary   Relevant Orders   CBC with Differential/Platelet   Comprehensive metabolic panel   TSH   UA/M w/rflx Culture, Routine   Need for hepatitis C screening test       Relevant Orders   Hepatitis C antibody   Screening for cardiovascular condition       Relevant Orders   Lipid Panel w/o Chol/HDL Ratio   Routine screening for STI (sexually transmitted infection)       Relevant Orders   HIV Antibody (routine testing w rflx)   RPR   HSV(herpes simplex vrs) 1+2 ab-IgG   GC/Chlamydia Probe Amp       Follow up plan: Return in about 1 year (around 12/29/2020) for CPE.   LABORATORY TESTING:  - Pap smear: up to date  IMMUNIZATIONS:   - Tdap: Tetanus vaccination status reviewed: last tetanus booster within 10 years. -  Influenza: Up to date  PATIENT COUNSELING:   Advised to take 1 mg of folate supplement per day if capable of pregnancy.   Sexuality: Discussed sexually transmitted diseases, partner selection, use of condoms, avoidance of unintended pregnancy  and contraceptive alternatives.   Advised to avoid cigarette smoking.  I discussed with the patient that most people either abstain from alcohol or drink within safe limits (<=14/week and <=4 drinks/occasion for males, <=7/weeks and <= 3 drinks/occasion for females) and that the risk for alcohol disorders and other health effects rises proportionally with the number of drinks per week and how often a drinker exceeds daily limits.  Discussed cessation/primary prevention of drug use and availability of treatment for abuse.   Diet: Encouraged to adjust caloric intake to maintain  or achieve ideal body weight, to reduce intake of dietary saturated fat and total fat, to limit sodium intake by avoiding high sodium foods and not adding table salt, and to maintain adequate dietary potassium and calcium preferably from fresh fruits, vegetables, and low-fat dairy products.    stressed the importance of regular exercise  Injury prevention: Discussed safety belts, safety helmets, smoke detector, smoking near bedding or upholstery.   Dental health: Discussed importance of regular tooth brushing, flossing, and dental visits.    NEXT PREVENTATIVE PHYSICAL DUE IN 1 YEAR. Return in about 1 year (around 12/29/2020) for CPE.

## 2019-12-31 LAB — CBC WITH DIFFERENTIAL/PLATELET
Basophils Absolute: 0 10*3/uL (ref 0.0–0.2)
Basos: 1 %
EOS (ABSOLUTE): 0.1 10*3/uL (ref 0.0–0.4)
Eos: 2 %
Hematocrit: 31 % — ABNORMAL LOW (ref 34.0–46.6)
Hemoglobin: 9.8 g/dL — ABNORMAL LOW (ref 11.1–15.9)
Immature Grans (Abs): 0 10*3/uL (ref 0.0–0.1)
Immature Granulocytes: 0 %
Lymphocytes Absolute: 1.4 10*3/uL (ref 0.7–3.1)
Lymphs: 32 %
MCH: 26.9 pg (ref 26.6–33.0)
MCHC: 31.6 g/dL (ref 31.5–35.7)
MCV: 85 fL (ref 79–97)
Monocytes Absolute: 0.3 10*3/uL (ref 0.1–0.9)
Monocytes: 8 %
Neutrophils Absolute: 2.6 10*3/uL (ref 1.4–7.0)
Neutrophils: 57 %
Platelets: 346 10*3/uL (ref 150–450)
RBC: 3.64 x10E6/uL — ABNORMAL LOW (ref 3.77–5.28)
RDW: 15.3 % (ref 11.7–15.4)
WBC: 4.6 10*3/uL (ref 3.4–10.8)

## 2019-12-31 LAB — COMPREHENSIVE METABOLIC PANEL
ALT: 20 IU/L (ref 0–32)
AST: 23 IU/L (ref 0–40)
Albumin/Globulin Ratio: 1.3 (ref 1.2–2.2)
Albumin: 3.9 g/dL (ref 3.8–4.8)
Alkaline Phosphatase: 72 IU/L (ref 48–121)
BUN/Creatinine Ratio: 11 (ref 9–23)
BUN: 7 mg/dL (ref 6–20)
Bilirubin Total: 0.5 mg/dL (ref 0.0–1.2)
CO2: 22 mmol/L (ref 20–29)
Calcium: 8.8 mg/dL (ref 8.7–10.2)
Chloride: 105 mmol/L (ref 96–106)
Creatinine, Ser: 0.63 mg/dL (ref 0.57–1.00)
GFR calc Af Amer: 136 mL/min/{1.73_m2} (ref >59)
GFR calc non Af Amer: 118 mL/min/{1.73_m2} (ref >59)
Globulin, Total: 2.9 g/dL (ref 1.5–4.5)
Glucose: 84 mg/dL (ref 65–99)
Potassium: 3.9 mmol/L (ref 3.5–5.2)
Sodium: 138 mmol/L (ref 134–144)
Total Protein: 6.8 g/dL (ref 6.0–8.5)

## 2019-12-31 LAB — LIPID PANEL W/O CHOL/HDL RATIO
Cholesterol, Total: 177 mg/dL (ref 100–199)
HDL: 60 mg/dL (ref >39)
LDL Chol Calc (NIH): 101 mg/dL — ABNORMAL HIGH (ref 0–99)
Triglycerides: 86 mg/dL (ref 0–149)
VLDL Cholesterol Cal: 16 mg/dL (ref 5–40)

## 2019-12-31 LAB — HIV ANTIBODY (ROUTINE TESTING W REFLEX): HIV Screen 4th Generation wRfx: NONREACTIVE

## 2019-12-31 LAB — HSV(HERPES SIMPLEX VRS) I + II AB-IGG
HSV 1 Glycoprotein G Ab, IgG: 50.7 index — ABNORMAL HIGH (ref 0.00–0.90)
HSV 2 IgG, Type Spec: <0.91 index (ref 0.00–0.90)

## 2019-12-31 LAB — RPR: RPR Ser Ql: NONREACTIVE

## 2019-12-31 LAB — TSH: TSH: 1.7 u[IU]/mL (ref 0.450–4.500)

## 2019-12-31 LAB — HEPATITIS C ANTIBODY: Hep C Virus Ab: <0.1 s/co ratio (ref 0.0–0.9)

## 2020-01-01 LAB — UA/M W/RFLX CULTURE, ROUTINE
Bilirubin, UA: NEGATIVE
Glucose, UA: NEGATIVE
Ketones, UA: NEGATIVE
Nitrite, UA: NEGATIVE
Protein,UA: NEGATIVE
RBC, UA: NEGATIVE
Specific Gravity, UA: 1.02 (ref 1.005–1.030)
Urobilinogen, Ur: 0.2 mg/dL (ref 0.2–1.0)
pH, UA: 7 (ref 5.0–7.5)

## 2020-01-01 LAB — GC/CHLAMYDIA PROBE AMP
Chlamydia trachomatis, NAA: NEGATIVE
Neisseria Gonorrhoeae by PCR: NEGATIVE

## 2020-01-01 LAB — URINE CULTURE, REFLEX: Organism ID, Bacteria: NO GROWTH

## 2020-01-01 LAB — MICROSCOPIC EXAMINATION
Bacteria, UA: NONE SEEN
RBC, Urine: NONE SEEN /hpf (ref 0–2)

## 2020-01-03 ENCOUNTER — Other Ambulatory Visit: Payer: Self-pay | Admitting: Family Medicine

## 2020-01-03 DIAGNOSIS — D649 Anemia, unspecified: Secondary | ICD-10-CM

## 2020-01-04 DIAGNOSIS — Z03818 Encounter for observation for suspected exposure to other biological agents ruled out: Secondary | ICD-10-CM | POA: Diagnosis not present

## 2020-01-04 DIAGNOSIS — Z20822 Contact with and (suspected) exposure to covid-19: Secondary | ICD-10-CM | POA: Diagnosis not present

## 2020-01-26 ENCOUNTER — Other Ambulatory Visit: Payer: Self-pay

## 2020-01-26 ENCOUNTER — Other Ambulatory Visit: Payer: Medicaid Other

## 2020-01-26 DIAGNOSIS — D649 Anemia, unspecified: Secondary | ICD-10-CM

## 2020-01-27 LAB — CBC WITH DIFFERENTIAL/PLATELET
Basophils Absolute: 0.1 10*3/uL (ref 0.0–0.2)
Basos: 1 %
EOS (ABSOLUTE): 0.1 10*3/uL (ref 0.0–0.4)
Eos: 2 %
Hematocrit: 30.6 % — ABNORMAL LOW (ref 34.0–46.6)
Hemoglobin: 9.7 g/dL — ABNORMAL LOW (ref 11.1–15.9)
Immature Grans (Abs): 0 10*3/uL (ref 0.0–0.1)
Immature Granulocytes: 0 %
Lymphocytes Absolute: 2.2 10*3/uL (ref 0.7–3.1)
Lymphs: 40 %
MCH: 26.2 pg — ABNORMAL LOW (ref 26.6–33.0)
MCHC: 31.7 g/dL (ref 31.5–35.7)
MCV: 83 fL (ref 79–97)
Monocytes Absolute: 0.4 10*3/uL (ref 0.1–0.9)
Monocytes: 7 %
Neutrophils Absolute: 2.8 10*3/uL (ref 1.4–7.0)
Neutrophils: 50 %
Platelets: 351 10*3/uL (ref 150–450)
RBC: 3.7 x10E6/uL — ABNORMAL LOW (ref 3.77–5.28)
RDW: 15.2 % (ref 11.7–15.4)
WBC: 5.5 10*3/uL (ref 3.4–10.8)

## 2020-01-27 NOTE — Progress Notes (Signed)
Contacted via MyChart  Good evening Karen Walton, your labs have returned and continue to show anemia being present.  Fleet Contras is no longer in office, but I would be glad to help with this.  My name is Terez Montee and I am NP in office.  Do you take iron every day?  Are your menstrual cycles heavy?  If would be good to schedule visit in 4 weeks in office so we can recheck this CBC and also check iron level and B12.  Any questions?

## 2020-03-04 DIAGNOSIS — Z20822 Contact with and (suspected) exposure to covid-19: Secondary | ICD-10-CM | POA: Diagnosis not present

## 2020-05-29 ENCOUNTER — Ambulatory Visit: Payer: Medicaid Other | Admitting: Nurse Practitioner

## 2020-06-07 ENCOUNTER — Other Ambulatory Visit: Payer: 59

## 2021-01-02 NOTE — Progress Notes (Signed)
BP (!) 129/91   Pulse 92   Temp 98.2 F (36.8 C)   Ht 5' 0.87" (1.546 m)   Wt 165 lb 8 oz (75.1 kg)   SpO2 100%   BMI 31.41 kg/m    Subjective:    Patient ID: Karen Walton, female    DOB: [birth year], 35 y.o.   MRN: [id number]  HPI: Karen Walton is a 35 y.o. female presenting on 01/03/2021 for comprehensive medical examination. Current medical complaints include:none  She currently lives with: Menopausal Symptoms: no   Denies HA, CP, SOB, dizziness, palpitations, visual changes, and lower extremity swelling.  Depression Screen done today and results listed below:  [facility]2021 12/30/2019 12/22/2018 08/26/2017  Decreased Interest 0 0 0 0  Down, Depressed, Hopeless 0 0 0 0  PHQ - 2 Score 0 0 0 0  Altered sleeping - 0 1 -  Tired, decreased energy - 1 1 -  Change in appetite - 2 1 -  Feeling bad or failure about yourself  - 1 1 -  Trouble concentrating - 0 1 -  Moving slowly or fidgety/restless - 0 0 -  Suicidal thoughts - 0 0 -  PHQ-9 Score - 4 5 -    The patient does not have a history of falls. I did complete a risk assessment for falls. A plan of care for falls was documented.   Past Medical History:  Past Medical History:  Diagnosis Date   Allergy     Surgical History:  Past Surgical History:  Procedure Laterality Date   CHOLECYSTECTOMY  2014   TUBAL LIGATION      Medications:  No current outpatient medications on file prior to visit.   No current facility-administered medications on file prior to visit.    Allergies:  Allergies  Allergen Reactions   Flagyl [Metronidazole] Nausea And Vomiting    Social History:  Social History   Socioeconomic History   Marital status: Single    Spouse name: Not on file   Number of children: Not on file   Years of education: Not on file   Highest education level: Not on file  Occupational History   Not on file  Tobacco Use   Smoking status: Never   Smokeless tobacco: Never   Vaping Use   Vaping Use: Never used  Substance and Sexual Activity   Alcohol use: Yes    Alcohol/week: 2.0 standard drinks    Types: 2 Glasses of wine per week    Comment: Socially   Drug use: No   Sexual activity: Yes    Birth control/protection: None, Surgical  Other Topics Concern   Not on file  Social History Narrative   Not on file   Social Determinants of Health   Financial Resource Strain: Not on file  Food Insecurity: Not on file  Transportation Needs: Not on file  Physical Activity: Not on file  Stress: Not on file  Social Connections: Not on file  Intimate Partner Violence: Not on file   Social History   Tobacco Use  Smoking Status Never  Smokeless Tobacco Never   Social History   Substance and Sexual Activity  Alcohol Use Yes   Alcohol/week: 2.0 standard drinks   Types: 2 Glasses of wine per week   Comment: Socially    Family History:  Family History  Problem Relation Age of Onset   Cancer Paternal Grandfather        brain   Diabetes Mother  Hypertension Mother    Hyperlipidemia Sister    Hypertension Sister    Depression Maternal Grandmother    Osteoporosis Maternal Grandmother    Brain cancer Maternal Grandfather    Diabetes Paternal Grandmother    COPD Paternal Grandmother    Congestive Heart Failure Paternal Grandmother    Thyroid disease Neg Hx     Past medical history, surgical history, medications, allergies, family history and social history reviewed with patient today and changes made to appropriate areas of the chart.   Review of Systems  Eyes:  Negative for blurred vision and double vision.  Respiratory:  Negative for shortness of breath.   Cardiovascular:  Negative for chest pain, palpitations and leg swelling.  Neurological:  Negative for dizziness and headaches.  All other ROS negative except what is listed above and in the HPI.      Objective:    BP (!) 129/91   Pulse 92   Temp 98.2 F (36.8 C)   Ht 5' 0.87"  (1.546 m)   Wt 165 lb 8 oz (75.1 kg)   SpO2 100%   BMI 31.41 kg/m   Wt Readings from Last 3 Encounters:  01/03/21 165 lb 8 oz (75.1 kg)  12/30/19 172 lb (78 kg)  12/22/18 165 lb 9.6 oz (75.1 kg)    Physical Exam Vitals and nursing note reviewed.  Constitutional:      General: She is awake. She is not in acute distress.    Appearance: She is well-developed. She is not ill-appearing.  HENT:     Head: Normocephalic and atraumatic.     Right Ear: Hearing, tympanic membrane, ear canal and external ear normal. No drainage.     Left Ear: Hearing, tympanic membrane, ear canal and external ear normal. No drainage.     Nose: Nose normal.     Right Sinus: No maxillary sinus tenderness or frontal sinus tenderness.     Left Sinus: No maxillary sinus tenderness or frontal sinus tenderness.     Mouth/Throat:     Mouth: Mucous membranes are moist.     Pharynx: Oropharynx is clear. Uvula midline. No pharyngeal swelling, oropharyngeal exudate or posterior oropharyngeal erythema.  Eyes:     General: Lids are normal.        Right eye: No discharge.        Left eye: No discharge.     Extraocular Movements: Extraocular movements intact.     Conjunctiva/sclera: Conjunctivae normal.     Pupils: Pupils are equal, round, and reactive to light.     Visual Fields: Right eye visual fields normal and left eye visual fields normal.  Neck:     Thyroid: No thyromegaly.     Vascular: No carotid bruit.     Trachea: Trachea normal.  Cardiovascular:     Rate and Rhythm: Normal rate and regular rhythm.     Heart sounds: Normal heart sounds. No murmur heard.   No gallop.  Pulmonary:     Effort: Pulmonary effort is normal. No accessory muscle usage or respiratory distress.     Breath sounds: Normal breath sounds.  Chest:  Breasts:    Right: Normal.     Left: Normal.  Abdominal:     General: Bowel sounds are normal.     Palpations: Abdomen is soft. There is no hepatomegaly or splenomegaly.     Tenderness:  There is no abdominal tenderness.  Musculoskeletal:        General: Normal range of motion.  Cervical back: Normal range of motion and neck supple.     Right lower leg: No edema.     Left lower leg: No edema.  Lymphadenopathy:     Head:     Right side of head: No submental, submandibular, tonsillar, preauricular or posterior auricular adenopathy.     Left side of head: No submental, submandibular, tonsillar, preauricular or posterior auricular adenopathy.     Cervical: No cervical adenopathy.     Upper Body:     Right upper body: No supraclavicular, axillary or pectoral adenopathy.     Left upper body: No supraclavicular, axillary or pectoral adenopathy.  Skin:    General: Skin is warm and dry.     Capillary Refill: Capillary refill takes less than 2 seconds.     Findings: No rash.  Neurological:     Mental Status: She is alert and oriented to person, place, and time.     Cranial Nerves: Cranial nerves are intact.     Gait: Gait is intact.     Deep Tendon Reflexes: Reflexes are normal and symmetric.     Reflex Scores:      Brachioradialis reflexes are 2+ on the right side and 2+ on the left side.      Patellar reflexes are 2+ on the right side and 2+ on the left side. Psychiatric:        Attention and Perception: Attention normal.        Mood and Affect: Mood normal.        Speech: Speech normal.        Behavior: Behavior normal. Behavior is cooperative.        Thought Content: Thought content normal.        Judgment: Judgment normal.    Results for orders placed or performed in visit on 01/26/20  CBC with Differential/Platelet  Result Value Ref Range   WBC 5.5 3.4 - 10.8 x10E3/uL   RBC 3.70 (L) 3.77 - 5.28 x10E6/uL   Hemoglobin 9.7 (L) 11.1 - 15.9 g/dL   Hematocrit 81.4 (L) 48.1 - 46.6 %   MCV 83 79 - 97 fL   MCH 26.2 (L) 26.6 - 33.0 pg   MCHC 31.7 31.5 - 35.7 g/dL   RDW 85.6 31.4 - 97.0 %   Platelets 351 150 - 450 x10E3/uL   Neutrophils 50 Not Estab. %   Lymphs 40  Not Estab. %   Monocytes 7 Not Estab. %   Eos 2 Not Estab. %   Basos 1 Not Estab. %   Neutrophils Absolute 2.8 1.4 - 7.0 x10E3/uL   Lymphocytes Absolute 2.2 0.7 - 3.1 x10E3/uL   Monocytes Absolute 0.4 0.1 - 0.9 x10E3/uL   EOS (ABSOLUTE) 0.1 0.0 - 0.4 x10E3/uL   Basophils Absolute 0.1 0.0 - 0.2 x10E3/uL   Immature Granulocytes 0 Not Estab. %   Immature Grans (Abs) 0.0 0.0 - 0.1 x10E3/uL      Assessment & Plan:   Problem List Items Addressed This Visit   None Visit Diagnoses     Annual physical exam    -  Primary   Health maintenance reviewed during visit today. Declines HPV. Up to date on PAP.  Labs ordered today.    Relevant Orders   CBC with Differential/Platelet   Comprehensive metabolic panel   Lipid panel   TSH   Urinalysis, Routine w reflex microscopic        Follow up plan: Return in about 1 year (around 01/03/2022) for Physical and  Fasting labs.   LABORATORY TESTING:  - Pap smear: up to date  IMMUNIZATIONS:   - Tdap: Tetanus vaccination status reviewed: last tetanus booster within 10 years. - Influenza: Postponed to flu season - Pneumovax: Not applicable - Prevnar: Not applicable - HPV: Refused - Zostavax vaccine: Not applicable  SCREENING: -Mammogram: Not applicable  - Colonoscopy: Not applicable  - Bone Density: Not applicable  -Hearing Test: Not applicable  -Spirometry: Not applicable   PATIENT COUNSELING:   Advised to take 1 mg of folate supplement per day if capable of pregnancy.   Sexuality: Discussed sexually transmitted diseases, partner selection, use of condoms, avoidance of unintended pregnancy  and contraceptive alternatives.   Advised to avoid cigarette smoking.  I discussed with the patient that most people either abstain from alcohol or drink within safe limits (<=14/week and <=4 drinks/occasion for males, <=7/weeks and <= 3 drinks/occasion for females) and that the risk for alcohol disorders and other health effects rises  proportionally with the number of drinks per week and how often a drinker exceeds daily limits.  Discussed cessation/primary prevention of drug use and availability of treatment for abuse.   Diet: Encouraged to adjust caloric intake to maintain  or achieve ideal body weight, to reduce intake of dietary saturated fat and total fat, to limit sodium intake by avoiding high sodium foods and not adding table salt, and to maintain adequate dietary potassium and calcium preferably from fresh fruits, vegetables, and low-fat dairy products.    stressed the importance of regular exercise  Injury prevention: Discussed safety belts, safety helmets, smoke detector, smoking near bedding or upholstery.   Dental health: Discussed importance of regular tooth brushing, flossing, and dental visits.    NEXT PREVENTATIVE PHYSICAL DUE IN 1 YEAR. Return in about 1 year (around 01/03/2022) for Physical and Fasting labs.

## 2021-01-03 ENCOUNTER — Encounter: Payer: Self-pay | Admitting: Nurse Practitioner

## 2021-01-03 ENCOUNTER — Other Ambulatory Visit: Payer: Self-pay

## 2021-01-03 ENCOUNTER — Ambulatory Visit (INDEPENDENT_AMBULATORY_CARE_PROVIDER_SITE_OTHER): Payer: Medicaid Other | Admitting: Nurse Practitioner

## 2021-01-03 VITALS — BP 129/91 | HR 92 | Temp 98.2°F | Ht 60.87 in | Wt 165.5 lb

## 2021-01-03 DIAGNOSIS — R8290 Unspecified abnormal findings in urine: Secondary | ICD-10-CM

## 2021-01-03 DIAGNOSIS — Z0000 Encounter for general adult medical examination without abnormal findings: Secondary | ICD-10-CM | POA: Diagnosis not present

## 2021-01-03 LAB — URINALYSIS, ROUTINE W REFLEX MICROSCOPIC
Bilirubin, UA: NEGATIVE
Glucose, UA: NEGATIVE
Ketones, UA: NEGATIVE
Nitrite, UA: NEGATIVE
Protein,UA: NEGATIVE
RBC, UA: NEGATIVE
Specific Gravity, UA: 1.02 (ref 1.005–1.030)
Urobilinogen, Ur: 0.2 mg/dL (ref 0.2–1.0)
pH, UA: 6.5 (ref 5.0–7.5)

## 2021-01-03 LAB — MICROSCOPIC EXAMINATION: RBC, Urine: NONE SEEN /hpf (ref 0–2)

## 2021-01-03 NOTE — Addendum Note (Signed)
Addended by: Larae Grooms on: 01/03/2021 10:29 AM   Modules accepted: Orders

## 2021-01-03 NOTE — Progress Notes (Signed)
Hi Andilynn.  Your urine had some bacteria in it.  I have sent it for culture and will let you know if you need to be treated.

## 2021-01-04 LAB — COMPREHENSIVE METABOLIC PANEL
ALT: 11 IU/L (ref 0–32)
AST: 21 IU/L (ref 0–40)
Albumin/Globulin Ratio: 1.4 (ref 1.2–2.2)
Albumin: 4.1 g/dL (ref 3.8–4.8)
Alkaline Phosphatase: 64 IU/L (ref 44–121)
BUN/Creatinine Ratio: 10 (ref 9–23)
BUN: 7 mg/dL (ref 6–20)
Bilirubin Total: 0.6 mg/dL (ref 0.0–1.2)
CO2: 22 mmol/L (ref 20–29)
Calcium: 9 mg/dL (ref 8.7–10.2)
Chloride: 103 mmol/L (ref 96–106)
Creatinine, Ser: 0.67 mg/dL (ref 0.57–1.00)
Globulin, Total: 3 g/dL (ref 1.5–4.5)
Glucose: 77 mg/dL (ref 65–99)
Potassium: 4.3 mmol/L (ref 3.5–5.2)
Sodium: 138 mmol/L (ref 134–144)
Total Protein: 7.1 g/dL (ref 6.0–8.5)
eGFR: 118 mL/min/{1.73_m2} (ref >59)

## 2021-01-04 LAB — LIPID PANEL
Chol/HDL Ratio: 3.2 ratio (ref 0.0–4.4)
Cholesterol, Total: 187 mg/dL (ref 100–199)
HDL: 58 mg/dL (ref >39)
LDL Chol Calc (NIH): 115 mg/dL — ABNORMAL HIGH (ref 0–99)
Triglycerides: 78 mg/dL (ref 0–149)
VLDL Cholesterol Cal: 14 mg/dL (ref 5–40)

## 2021-01-04 LAB — CBC WITH DIFFERENTIAL/PLATELET
Basophils Absolute: 0.1 10*3/uL (ref 0.0–0.2)
Basos: 2 %
EOS (ABSOLUTE): 0.1 10*3/uL (ref 0.0–0.4)
Eos: 1 %
Hematocrit: 31.7 % — ABNORMAL LOW (ref 34.0–46.6)
Hemoglobin: 9.5 g/dL — ABNORMAL LOW (ref 11.1–15.9)
Immature Grans (Abs): 0 10*3/uL (ref 0.0–0.1)
Immature Granulocytes: 0 %
Lymphocytes Absolute: 1.8 10*3/uL (ref 0.7–3.1)
Lymphs: 35 %
MCH: 24.2 pg — ABNORMAL LOW (ref 26.6–33.0)
MCHC: 30 g/dL — ABNORMAL LOW (ref 31.5–35.7)
MCV: 81 fL (ref 79–97)
Monocytes Absolute: 0.5 10*3/uL (ref 0.1–0.9)
Monocytes: 9 %
Neutrophils Absolute: 2.7 10*3/uL (ref 1.4–7.0)
Neutrophils: 53 %
Platelets: 342 10*3/uL (ref 150–450)
RBC: 3.93 x10E6/uL (ref 3.77–5.28)
RDW: 16 % — ABNORMAL HIGH (ref 11.7–15.4)
WBC: 5.1 10*3/uL (ref 3.4–10.8)

## 2021-01-04 LAB — TSH: TSH: 1.47 u[IU]/mL (ref 0.450–4.500)

## 2021-01-04 NOTE — Progress Notes (Signed)
Hi Karen Walton.  Your lab work shows that you have some anemia.  Likely related to an iron deficiency.  Recommend an over the counter iron supplement to help improve this.  It is consitent with your labs from previous years.  Otherwise your lab work looks good.  Follow up as discussed.  Please let me know if you have any questions.

## 2021-01-05 LAB — URINE CULTURE

## 2021-02-27 ENCOUNTER — Other Ambulatory Visit: Payer: Self-pay

## 2021-02-27 ENCOUNTER — Encounter: Payer: Self-pay | Admitting: Nurse Practitioner

## 2021-02-27 ENCOUNTER — Ambulatory Visit: Payer: Medicaid Other | Admitting: Nurse Practitioner

## 2021-02-27 VITALS — BP 128/78 | HR 94 | Resp 98 | Ht 60.0 in | Wt 166.0 lb

## 2021-02-27 DIAGNOSIS — F339 Major depressive disorder, recurrent, unspecified: Secondary | ICD-10-CM

## 2021-02-27 DIAGNOSIS — F419 Anxiety disorder, unspecified: Secondary | ICD-10-CM

## 2021-02-27 MED ORDER — SERTRALINE HCL 25 MG PO TABS
25.0000 mg | ORAL_TABLET | Freq: Every day | ORAL | 0 refills | Status: DC
Start: 2021-02-27 — End: 2021-02-27
  Filled 2021-02-27: qty 30, 30d supply, fill #0

## 2021-02-27 MED ORDER — SERTRALINE HCL 25 MG PO TABS
25.0000 mg | ORAL_TABLET | Freq: Every day | ORAL | 0 refills | Status: DC
Start: 2021-02-27 — End: 2021-04-01

## 2021-02-27 NOTE — Progress Notes (Signed)
BP 128/78   Pulse 94   Resp (!) 98   Ht 5' (1.524 m)   Wt 166 lb (75.3 kg)   BMI 32.42 kg/m    Subjective:    Patient ID: Karen Walton, female    DOB: [birth year], 35 y.o.   MRN: [id number]  HPI: Karen Walton is a 35 y.o. female  Chief Complaint  Patient presents with   Anxiety   ANXIETY/STRESS Duration:uncontrolled Anxious mood: yes  Excessive worrying: yes Irritability: yes  Sweating: no Nausea: yes Palpitations:no Hyperventilation: yes Panic attacks: yes Agoraphobia: yes  Obscessions/compulsions: no Depressed mood: yes Depression screen [facility] 2/9 02/27/2021 01/03/2021 12/30/2019 12/22/2018 08/26/2017  Decreased Interest 3 0 0 0 0  Down, Depressed, Hopeless 3 0 0 0 0  PHQ - 2 Score 6 0 0 0 0  Altered sleeping 2 - 0 1 -  Tired, decreased energy 3 - 1 1 -  Change in appetite 0 - 2 1 -  Feeling bad or failure about yourself  1 - 1 1 -  Trouble concentrating 1 - 0 1 -  Moving slowly or fidgety/restless 1 - 0 0 -  Suicidal thoughts 0 - 0 0 -  PHQ-9 Score 14 - 4 5 -  Difficult doing work/chores Somewhat difficult - - - -   Anhedonia: no Weight changes: no Insomnia: yes  hard to fall asleep and stay asleep   Hypersomnia: yes Fatigue/loss of energy: yes Feelings of worthlessness: yes Feelings of guilt: yes Impaired concentration/indecisiveness: yes Suicidal ideations: no  Crying spells: yes Recent Stressors/Life Changes: yes   Relationship problems: no   Family stress: yes     Financial stress: no    Job stress: no    Recent death/loss: yes  GAD 7 : Generalized Anxiety Score 02/27/2021 12/30/2019  Nervous, Anxious, on Edge 3 1  Control/stop worrying 3 1  Worry too much - different things 3 1  Trouble relaxing 3 1  Restless 3 0  Easily annoyed or irritable 3 0  Afraid - awful might happen 3 1  Total GAD 7 Score 21 5  Anxiety Difficulty Very difficult Not difficult at all     Relevant past medical, surgical, family and social history reviewed and  updated as indicated. Interim medical history since our last visit reviewed. Allergies and medications reviewed and updated.  Review of Systems  Psychiatric/Behavioral:  Positive for dysphoric mood. Negative for suicidal ideas. The patient is nervous/anxious.    Per HPI unless specifically indicated above     Objective:    BP 128/78   Pulse 94   Resp (!) 98   Ht 5' (1.524 m)   Wt 166 lb (75.3 kg)   BMI 32.42 kg/m   Wt Readings from Last 3 Encounters:  02/27/21 166 lb (75.3 kg)  01/03/21 165 lb 8 oz (75.1 kg)  12/30/19 172 lb (78 kg)    Physical Exam Vitals and nursing note reviewed.  Constitutional:      General: She is not in acute distress.    Appearance: Normal appearance. She is normal weight. She is not ill-appearing, toxic-appearing or diaphoretic.  HENT:     Head: Normocephalic.     Right Ear: External ear normal.     Left Ear: External ear normal.     Nose: Nose normal.     Mouth/Throat:     Mouth: Mucous membranes are moist.     Pharynx: Oropharynx is clear.  Eyes:     General:  Right eye: No discharge.        Left eye: No discharge.     Extraocular Movements: Extraocular movements intact.     Conjunctiva/sclera: Conjunctivae normal.     Pupils: Pupils are equal, round, and reactive to light.  Cardiovascular:     Rate and Rhythm: Normal rate and regular rhythm.     Heart sounds: No murmur heard. Pulmonary:     Effort: Pulmonary effort is normal. No respiratory distress.     Breath sounds: Normal breath sounds. No wheezing or rales.  Musculoskeletal:     Cervical back: Normal range of motion and neck supple.  Skin:    General: Skin is warm and dry.     Capillary Refill: Capillary refill takes less than 2 seconds.  Neurological:     General: No focal deficit present.     Mental Status: She is alert and oriented to person, place, and time. Mental status is at baseline.  Psychiatric:        Mood and Affect: Mood normal.        Behavior: Behavior  normal.        Thought Content: Thought content normal.        Judgment: Judgment normal.    Results for orders placed or performed in visit on 01/03/21  Microscopic Examination   Urine  Result Value Ref Range   WBC, UA 0-5 0 - 5 /hpf   RBC None seen 0 - 2 /hpf   Epithelial Cells (non renal) 0-10 0 - 10 /hpf   Mucus, UA Present (A) Not Estab.   Bacteria, UA Moderate (A) None seen/Few  Urine Culture   Specimen: Urine   UR  Result Value Ref Range   Urine Culture, Routine Final report    Organism ID, Bacteria Comment   CBC with Differential/Platelet  Result Value Ref Range   WBC 5.1 3.4 - 10.8 x10E3/uL   RBC 3.93 3.77 - 5.28 x10E6/uL   Hemoglobin 9.5 (L) 11.1 - 15.9 g/dL   Hematocrit 31.7 (L) 34.0 - 46.6 %   MCV 81 79 - 97 fL   MCH 24.2 (L) 26.6 - 33.0 pg   MCHC 30.0 (L) 31.5 - 35.7 g/dL   RDW 16.0 (H) 11.7 - 15.4 %   Platelets 342 150 - 450 x10E3/uL   Neutrophils 53 Not Estab. %   Lymphs 35 Not Estab. %   Monocytes 9 Not Estab. %   Eos 1 Not Estab. %   Basos 2 Not Estab. %   Neutrophils Absolute 2.7 1.4 - 7.0 x10E3/uL   Lymphocytes Absolute 1.8 0.7 - 3.1 x10E3/uL   Monocytes Absolute 0.5 0.1 - 0.9 x10E3/uL   EOS (ABSOLUTE) 0.1 0.0 - 0.4 x10E3/uL   Basophils Absolute 0.1 0.0 - 0.2 x10E3/uL   Immature Granulocytes 0 Not Estab. %   Immature Grans (Abs) 0.0 0.0 - 0.1 x10E3/uL  Comprehensive metabolic panel  Result Value Ref Range   Glucose 77 65 - 99 mg/dL   BUN 7 6 - 20 mg/dL   Creatinine, Ser 0.67 0.57 - 1.00 mg/dL   eGFR 118 >59 mL/min/1.73   BUN/Creatinine Ratio 10 9 - 23   Sodium 138 134 - 144 mmol/L   Potassium 4.3 3.5 - 5.2 mmol/L   Chloride 103 96 - 106 mmol/L   CO2 22 20 - 29 mmol/L   Calcium 9.0 8.7 - 10.2 mg/dL   Total Protein 7.1 6.0 - 8.5 g/dL   Albumin 4.1 3.8 - 4.8 g/dL  Globulin, Total 3.0 1.5 - 4.5 g/dL   Albumin/Globulin Ratio 1.4 1.2 - 2.2   Bilirubin Total 0.6 0.0 - 1.2 mg/dL   Alkaline Phosphatase 64 44 - 121 IU/L   AST 21 0 - 40 IU/L    ALT 11 0 - 32 IU/L  Lipid panel  Result Value Ref Range   Cholesterol, Total 187 100 - 199 mg/dL   Triglycerides 78 0 - 149 mg/dL   HDL 58 >39 mg/dL   VLDL Cholesterol Cal 14 5 - 40 mg/dL   LDL Chol Calc (NIH) 115 (H) 0 - 99 mg/dL   Chol/HDL Ratio 3.2 0.0 - 4.4 ratio  TSH  Result Value Ref Range   TSH 1.470 0.450 - 4.500 uIU/mL  Urinalysis, Routine w reflex microscopic  Result Value Ref Range   Specific Gravity, UA 1.020 1.005 - 1.030   pH, UA 6.5 5.0 - 7.5   Color, UA Yellow Yellow   Appearance Ur Cloudy (A) Clear   Leukocytes,UA 1+ (A) Negative   Protein,UA Negative Negative/Trace   Glucose, UA Negative Negative   Ketones, UA Negative Negative   RBC, UA Negative Negative   Bilirubin, UA Negative Negative   Urobilinogen, Ur 0.2 0.2 - 1.0 mg/dL   Nitrite, UA Negative Negative   Microscopic Examination See below:       Assessment & Plan:   Problem List Items Addressed This Visit       Other   Depression, recurrent ([place]) - Primary    Chronic.  Not well controlled.  Patient is ready to start medication.  Will start Zoloft 31m daily. Side effects and benefits of medication discussed with patient during visit today.  Will follow up with patien tin 2 weeks for reevaluation.      Relevant Medications   sertraline (ZOLOFT) 25 MG tablet   Anxiety    Chronic.  Not well controlled.  Patient is ready to start medication.  Will start Zoloft 274mdaily. Side effects and benefits of medication discussed with patient during visit today.  Will follow up with patien tin 2 weeks for reevaluation.      Relevant Medications   sertraline (ZOLOFT) 25 MG tablet     Follow up plan: Return in about 1 month (around 03/30/2021) for Depression/Anxiety FU (virtual).

## 2021-02-28 DIAGNOSIS — F419 Anxiety disorder, unspecified: Secondary | ICD-10-CM | POA: Insufficient documentation

## 2021-02-28 DIAGNOSIS — F339 Major depressive disorder, recurrent, unspecified: Secondary | ICD-10-CM | POA: Insufficient documentation

## 2021-02-28 NOTE — Assessment & Plan Note (Signed)
Chronic.  Not well controlled.  Patient is ready to start medication.  Will start Zoloft 25mg daily. Side effects and benefits of medication discussed with patient during visit today.  Will follow up with patien tin 2 weeks for reevaluation. 

## 2021-02-28 NOTE — Assessment & Plan Note (Signed)
Chronic.  Not well controlled.  Patient is ready to start medication.  Will start Zoloft 25mg  daily. Side effects and benefits of medication discussed with patient during visit today.  Will follow up with patien tin 2 weeks for reevaluation.

## 2021-04-01 ENCOUNTER — Telehealth (INDEPENDENT_AMBULATORY_CARE_PROVIDER_SITE_OTHER): Payer: Medicaid Other | Admitting: Nurse Practitioner

## 2021-04-01 ENCOUNTER — Encounter: Payer: Self-pay | Admitting: Nurse Practitioner

## 2021-04-01 DIAGNOSIS — F339 Major depressive disorder, recurrent, unspecified: Secondary | ICD-10-CM | POA: Diagnosis not present

## 2021-04-01 DIAGNOSIS — F419 Anxiety disorder, unspecified: Secondary | ICD-10-CM

## 2021-04-01 MED ORDER — SERTRALINE HCL 25 MG PO TABS: 25.0000 mg | ORAL_TABLET | Freq: Every day | ORAL | 1 refills | Status: DC

## 2021-04-01 NOTE — Progress Notes (Signed)
LMP 03/28/2021 (Exact Date)    Subjective:    Patient ID: Karen Walton, female    DOB: [birth year], 35 y.o.   MRN: [id number]  HPI: Karen Walton is a 35 y.o. female  Chief Complaint  Patient presents with   Depression   DEPRESSION/ANXIETY Patient seen today to follow up on Depression/Anxiety.  Patient states she is feeling better.  States at first she had some side effects but now feels the benefits of the medication.  She feels like this is a good dose for her.  She had some jitteriness when she first started it.  Denies SI.   Flowsheet Row Video Visit from 04/01/2021 in [facility]  PHQ-9 Total Score 3      GAD 7 : Generalized Anxiety Score 04/01/2021 02/27/2021 12/30/2019  Nervous, Anxious, on Edge '1 3 1  ' Control/stop worrying '1 3 1  ' Worry too much - different things '1 3 1  ' Trouble relaxing 0 3 1  Restless 0 3 0  Easily annoyed or irritable 0 3 0  Afraid - awful might happen '1 3 1  ' Total GAD 7 Score '4 21 5  ' Anxiety Difficulty Not difficult at all Very difficult Not difficult at all      Relevant past medical, surgical, family and social history reviewed and updated as indicated. Interim medical history since our last visit reviewed. Allergies and medications reviewed and updated.  Review of Systems  Psychiatric/Behavioral:  Positive for dysphoric mood. Negative for suicidal ideas. The patient is nervous/anxious.    Per HPI unless specifically indicated above     Objective:    LMP 03/28/2021 (Exact Date)   Wt Readings from Last 3 Encounters:  02/27/21 166 lb (75.3 kg)  01/03/21 165 lb 8 oz (75.1 kg)  12/30/19 172 lb (78 kg)    Physical Exam Vitals and nursing note reviewed.  HENT:     Head: Normocephalic.     Right Ear: Hearing normal.     Left Ear: Hearing normal.     Nose: Nose normal.  Eyes:     Pupils: Pupils are equal, round, and reactive to light.  Pulmonary:     Effort: Pulmonary effort is normal. No respiratory distress.   Neurological:     Mental Status: She is alert.  Psychiatric:        Mood and Affect: Mood normal.        Behavior: Behavior normal.        Thought Content: Thought content normal.        Judgment: Judgment normal.    Results for orders placed or performed in visit on 01/03/21  Microscopic Examination   Urine  Result Value Ref Range   WBC, UA 0-5 0 - 5 /hpf   RBC None seen 0 - 2 /hpf   Epithelial Cells (non renal) 0-10 0 - 10 /hpf   Mucus, UA Present (A) Not Estab.   Bacteria, UA Moderate (A) None seen/Few  Urine Culture   Specimen: Urine   UR  Result Value Ref Range   Urine Culture, Routine Final report    Organism ID, Bacteria Comment   CBC with Differential/Platelet  Result Value Ref Range   WBC 5.1 3.4 - 10.8 x10E3/uL   RBC 3.93 3.77 - 5.28 x10E6/uL   Hemoglobin 9.5 (L) 11.1 - 15.9 g/dL   Hematocrit 31.7 (L) 34.0 - 46.6 %   MCV 81 79 - 97 fL   MCH 24.2 (L) 26.6 - 33.0 pg  MCHC 30.0 (L) 31.5 - 35.7 g/dL   RDW 16.0 (H) 11.7 - 15.4 %   Platelets 342 150 - 450 x10E3/uL   Neutrophils 53 Not Estab. %   Lymphs 35 Not Estab. %   Monocytes 9 Not Estab. %   Eos 1 Not Estab. %   Basos 2 Not Estab. %   Neutrophils Absolute 2.7 1.4 - 7.0 x10E3/uL   Lymphocytes Absolute 1.8 0.7 - 3.1 x10E3/uL   Monocytes Absolute 0.5 0.1 - 0.9 x10E3/uL   EOS (ABSOLUTE) 0.1 0.0 - 0.4 x10E3/uL   Basophils Absolute 0.1 0.0 - 0.2 x10E3/uL   Immature Granulocytes 0 Not Estab. %   Immature Grans (Abs) 0.0 0.0 - 0.1 x10E3/uL  Comprehensive metabolic panel  Result Value Ref Range   Glucose 77 65 - 99 mg/dL   BUN 7 6 - 20 mg/dL   Creatinine, Ser 0.67 0.57 - 1.00 mg/dL   eGFR 118 >59 mL/min/1.73   BUN/Creatinine Ratio 10 9 - 23   Sodium 138 134 - 144 mmol/L   Potassium 4.3 3.5 - 5.2 mmol/L   Chloride 103 96 - 106 mmol/L   CO2 22 20 - 29 mmol/L   Calcium 9.0 8.7 - 10.2 mg/dL   Total Protein 7.1 6.0 - 8.5 g/dL   Albumin 4.1 3.8 - 4.8 g/dL   Globulin, Total 3.0 1.5 - 4.5 g/dL    Albumin/Globulin Ratio 1.4 1.2 - 2.2   Bilirubin Total 0.6 0.0 - 1.2 mg/dL   Alkaline Phosphatase 64 44 - 121 IU/L   AST 21 0 - 40 IU/L   ALT 11 0 - 32 IU/L  Lipid panel  Result Value Ref Range   Cholesterol, Total 187 100 - 199 mg/dL   Triglycerides 78 0 - 149 mg/dL   HDL 58 >39 mg/dL   VLDL Cholesterol Cal 14 5 - 40 mg/dL   LDL Chol Calc (NIH) 115 (H) 0 - 99 mg/dL   Chol/HDL Ratio 3.2 0.0 - 4.4 ratio  TSH  Result Value Ref Range   TSH 1.470 0.450 - 4.500 uIU/mL  Urinalysis, Routine w reflex microscopic  Result Value Ref Range   Specific Gravity, UA 1.020 1.005 - 1.030   pH, UA 6.5 5.0 - 7.5   Color, UA Yellow Yellow   Appearance Ur Cloudy (A) Clear   Leukocytes,UA 1+ (A) Negative   Protein,UA Negative Negative/Trace   Glucose, UA Negative Negative   Ketones, UA Negative Negative   RBC, UA Negative Negative   Bilirubin, UA Negative Negative   Urobilinogen, Ur 0.2 0.2 - 1.0 mg/dL   Nitrite, UA Negative Negative   Microscopic Examination See below:       Assessment & Plan:   Problem List Items Addressed This Visit       Other   Depression, recurrent ([place]) - Primary    Chronic.  Improved with Zoloft 31m.  Continue with current medication regimen. Refills sent today.  Return to clinic in 3 months for reevaluation.  Call sooner if concerns arise.        Relevant Medications   sertraline (ZOLOFT) 25 MG tablet   Anxiety    Chronic.  Improved with Zoloft 235m  Continue with current medication regimen. Refills sent today.  Return to clinic in 3 months for reevaluation.  Call sooner if concerns arise.        Relevant Medications   sertraline (ZOLOFT) 25 MG tablet     Follow up plan: Return in about 1 month (around 05/01/2021) for  Depression/Anxiety FU.   This visit was completed via MyChart due to the restrictions of the COVID-19 pandemic. All issues as above were discussed and addressed. Physical exam was done as above through visual confirmation on MyChart. If it  was felt that the patient should be evaluated in the office, they were directed there. The patient verbally consented to this visit. Location of the patient: Home Location of the provider: Office Those involved with this call:  Provider: Jon Billings, NP CMA: Yvonna Alanis, [place] Desk/Registration: Myrlene Broker This encounter was conducted via video.  I spent 15 dedicated to the care of this patient on the date of this encounter to include previsit review of 20, face to face time with the patient, and post visit ordering of testing.

## 2021-04-01 NOTE — Progress Notes (Signed)
Appointment has been made

## 2021-04-01 NOTE — Assessment & Plan Note (Signed)
Chronic.  Improved with Zoloft 25mg .  Continue with current medication regimen. Refills sent today.  Return to clinic in 3 months for reevaluation.  Call sooner if concerns arise.

## 2021-05-02 ENCOUNTER — Ambulatory Visit: Payer: Medicaid Other | Admitting: Nurse Practitioner

## 2021-12-24 ENCOUNTER — Encounter: Payer: Self-pay | Admitting: Nurse Practitioner

## 2022-01-06 ENCOUNTER — Encounter: Payer: Medicaid Other | Admitting: Nurse Practitioner

## 2022-01-21 NOTE — Progress Notes (Unsigned)
There were no vitals taken for this visit.   Subjective:    Patient ID: Karen Walton, female    DOB: 1985-06-05, 36 y.o.   MRN: [id number]  HPI: Karen Walton is a 36 y.o. female presenting on 01/22/2022 for comprehensive medical examination. Current medical complaints include:none  She currently lives with: Menopausal Symptoms: no   Denies HA, CP, SOB, dizziness, palpitations, visual changes, and lower extremity swelling.  Depression Screen done today and results listed below:     04/01/2021   11:24 AM 02/27/2021    3:58 PM 01/03/2021    9:03 AM 12/30/2019   10:32 AM 12/22/2018    2:29 PM  Depression screen PHQ 2/9  Decreased Interest 1 3 0 0 0  Down, Depressed, Hopeless 0 3 0 0 0  PHQ - 2 Score 1 6 0 0 0  Altered sleeping 0 2  0 1  Tired, decreased energy _0 Change in appetite 0 0  2 1  Feeling bad or failure about yourself  _1 Trouble concentrating 0 1  0 1  Moving slowly or fidgety/restless 0 1  0 0  Suicidal thoughts 0 0  0 0  PHQ-9 Score _2 Difficult doing work/chores Not difficult at all Somewhat difficult       The patient does not have a history of falls. I did complete a risk assessment for falls. A plan of care for falls was documented.   Past Medical History:  Past Medical History:  Diagnosis Date  . Allergy     Surgical History:  Past Surgical History:  Procedure Laterality Date  . CHOLECYSTECTOMY  2014  . TUBAL LIGATION      Medications:  Current Outpatient Medications on File Prior to Visit  Medication Sig  . sertraline (ZOLOFT) 25 MG tablet Take 1 tablet (25 mg total) by mouth daily.   No current facility-administered medications on file prior to visit.    Allergies:  Allergies  Allergen Reactions  . Flagyl [Metronidazole] Nausea And Vomiting    Social History:  Social History   Socioeconomic History  . Marital status: Single    Spouse name: Not on file  . Number of children: Not on file  . Years of  education: Not on file  . Highest education level: Not on file  Occupational History  . Not on file  Tobacco Use  . Smoking status: Never  . Smokeless tobacco: Never  Vaping Use  . Vaping Use: Never used  Substance and Sexual Activity  . Alcohol use: Yes    Alcohol/week: 2.0 standard drinks of alcohol    Types: 2 Glasses of wine per week    Comment: Socially  . Drug use: No  . Sexual activity: Yes    Birth control/protection: None, Surgical  Other Topics Concern  . Not on file  Social History Narrative  . Not on file   Social Determinants of Health   Financial Resource Strain: Not on file  Food Insecurity: Not on file  Transportation Needs: Not on file  Physical Activity: Not on file  Stress: Not on file  Social Connections: Not on file  Intimate Partner Violence: Not on file   Social History   Tobacco Use  Smoking Status Never  Smokeless Tobacco Never   Social History   Substance and Sexual Activity  Alcohol Use Yes  . Alcohol/week: 2.0 standard drinks of alcohol  .  Types: 2 Glasses of wine per week   Comment: Socially    Family History:  Family History  Problem Relation Age of Onset  . Cancer Paternal Grandfather        brain  . Diabetes Mother   . Hypertension Mother   . Hyperlipidemia Sister   . Hypertension Sister   . Depression Maternal Grandmother   . Osteoporosis Maternal Grandmother   . Brain cancer Maternal Grandfather   . Diabetes Paternal Grandmother   . COPD Paternal Grandmother   . Congestive Heart Failure Paternal Grandmother   . Thyroid disease Neg Hx     Past medical history, surgical history, medications, allergies, family history and social history reviewed with patient today and changes made to appropriate areas of the chart.   Review of Systems  Eyes:  Negative for blurred vision and double vision.  Respiratory:  Negative for shortness of breath.   Cardiovascular:  Negative for chest pain, palpitations and leg swelling.   Neurological:  Negative for dizziness and headaches.  All other ROS negative except what is listed above and in the HPI.      Objective:    There were no vitals taken for this visit.  Wt Readings from Last 3 Encounters:  02/27/21 166 lb (75.3 kg)  01/03/21 165 lb 8 oz (75.1 kg)  12/30/19 172 lb (78 kg)    Physical Exam Vitals and nursing note reviewed.  Constitutional:      General: She is awake. She is not in acute distress.    Appearance: She is well-developed. She is not ill-appearing.  HENT:     Head: Normocephalic and atraumatic.     Right Ear: Hearing, tympanic membrane, ear canal and external ear normal. No drainage.     Left Ear: Hearing, tympanic membrane, ear canal and external ear normal. No drainage.     Nose: Nose normal.     Right Sinus: No maxillary sinus tenderness or frontal sinus tenderness.     Left Sinus: No maxillary sinus tenderness or frontal sinus tenderness.     Mouth/Throat:     Mouth: Mucous membranes are moist.     Pharynx: Oropharynx is clear. Uvula midline. No pharyngeal swelling, oropharyngeal exudate or posterior oropharyngeal erythema.  Eyes:     General: Lids are normal.        Right eye: No discharge.        Left eye: No discharge.     Extraocular Movements: Extraocular movements intact.     Conjunctiva/sclera: Conjunctivae normal.     Pupils: Pupils are equal, round, and reactive to light.     Visual Fields: Right eye visual fields normal and left eye visual fields normal.  Neck:     Thyroid: No thyromegaly.     Vascular: No carotid bruit.     Trachea: Trachea normal.  Cardiovascular:     Rate and Rhythm: Normal rate and regular rhythm.     Heart sounds: Normal heart sounds. No murmur heard.    No gallop.  Pulmonary:     Effort: Pulmonary effort is normal. No accessory muscle usage or respiratory distress.     Breath sounds: Normal breath sounds.  Chest:  Breasts:    Right: Normal.     Left: Normal.  Abdominal:     General:  Bowel sounds are normal.     Palpations: Abdomen is soft. There is no hepatomegaly or splenomegaly.     Tenderness: There is no abdominal tenderness.  Musculoskeletal:  General: Normal range of motion.     Cervical back: Normal range of motion and neck supple.     Right lower leg: No edema.     Left lower leg: No edema.  Lymphadenopathy:     Head:     Right side of head: No submental, submandibular, tonsillar, preauricular or posterior auricular adenopathy.     Left side of head: No submental, submandibular, tonsillar, preauricular or posterior auricular adenopathy.     Cervical: No cervical adenopathy.     Upper Body:     Right upper body: No supraclavicular, axillary or pectoral adenopathy.     Left upper body: No supraclavicular, axillary or pectoral adenopathy.  Skin:    General: Skin is warm and dry.     Capillary Refill: Capillary refill takes less than 2 seconds.     Findings: No rash.  Neurological:     Mental Status: She is alert and oriented to person, place, and time.     Gait: Gait is intact.     Deep Tendon Reflexes: Reflexes are normal and symmetric.     Reflex Scores:      Brachioradialis reflexes are 2+ on the right side and 2+ on the left side.      Patellar reflexes are 2+ on the right side and 2+ on the left side. Psychiatric:        Attention and Perception: Attention normal.        Mood and Affect: Mood normal.        Speech: Speech normal.        Behavior: Behavior normal. Behavior is cooperative.        Thought Content: Thought content normal.        Judgment: Judgment normal.    Results for orders placed or performed in visit on 01/03/21  Microscopic Examination   Urine  Result Value Ref Range   WBC, UA 0-5 0 - 5 /hpf   RBC, Urine None seen 0 - 2 /hpf   Epithelial Cells (non renal) 0-10 0 - 10 /hpf   Mucus, UA Present (A) Not Estab.   Bacteria, UA Moderate (A) None seen/Few  Urine Culture   Specimen: Urine   UR  Result Value Ref Range    Urine Culture, Routine Final report    Organism ID, Bacteria Comment   CBC with Differential/Platelet  Result Value Ref Range   WBC 5.1 3.4 - 10.8 x10E3/uL   RBC 3.93 3.77 - 5.28 x10E6/uL   Hemoglobin 9.5 (L) 11.1 - 15.9 g/dL   Hematocrit 31.7 (L) 34.0 - 46.6 %   MCV 81 79 - 97 fL   MCH 24.2 (L) 26.6 - 33.0 pg   MCHC 30.0 (L) 31.5 - 35.7 g/dL   RDW 16.0 (H) 11.7 - 15.4 %   Platelets 342 150 - 450 x10E3/uL   Neutrophils 53 Not Estab. %   Lymphs 35 Not Estab. %   Monocytes 9 Not Estab. %   Eos 1 Not Estab. %   Basos 2 Not Estab. %   Neutrophils Absolute 2.7 1.4 - 7.0 x10E3/uL   Lymphocytes Absolute 1.8 0.7 - 3.1 x10E3/uL   Monocytes Absolute 0.5 0.1 - 0.9 x10E3/uL   EOS (ABSOLUTE) 0.1 0.0 - 0.4 x10E3/uL   Basophils Absolute 0.1 0.0 - 0.2 x10E3/uL   Immature Granulocytes 0 Not Estab. %   Immature Grans (Abs) 0.0 0.0 - 0.1 x10E3/uL  Comprehensive metabolic panel  Result Value Ref Range   Glucose 77 65 -  99 mg/dL   BUN 7 6 - 20 mg/dL   Creatinine, Ser 0.67 0.57 - 1.00 mg/dL   eGFR 118 >59 mL/min/1.73   BUN/Creatinine Ratio 10 9 - 23   Sodium 138 134 - 144 mmol/L   Potassium 4.3 3.5 - 5.2 mmol/L   Chloride 103 96 - 106 mmol/L   CO2 22 20 - 29 mmol/L   Calcium 9.0 8.7 - 10.2 mg/dL   Total Protein 7.1 6.0 - 8.5 g/dL   Albumin 4.1 3.8 - 4.8 g/dL   Globulin, Total 3.0 1.5 - 4.5 g/dL   Albumin/Globulin Ratio 1.4 1.2 - 2.2   Bilirubin Total 0.6 0.0 - 1.2 mg/dL   Alkaline Phosphatase 64 44 - 121 IU/L   AST 21 0 - 40 IU/L   ALT 11 0 - 32 IU/L  Lipid panel  Result Value Ref Range   Cholesterol, Total 187 100 - 199 mg/dL   Triglycerides 78 0 - 149 mg/dL   HDL 58 >39 mg/dL   VLDL Cholesterol Cal 14 5 - 40 mg/dL   LDL Chol Calc (NIH) 115 (H) 0 - 99 mg/dL   Chol/HDL Ratio 3.2 0.0 - 4.4 ratio  TSH  Result Value Ref Range   TSH 1.470 0.450 - 4.500 uIU/mL  Urinalysis, Routine w reflex microscopic  Result Value Ref Range   Specific Gravity, UA 1.020 1.005 - 1.030   pH, UA 6.5 5.0  - 7.5   Color, UA Yellow Yellow   Appearance Ur Cloudy (A) Clear   Leukocytes,UA 1+ (A) Negative   Protein,UA Negative Negative/Trace   Glucose, UA Negative Negative   Ketones, UA Negative Negative   RBC, UA Negative Negative   Bilirubin, UA Negative Negative   Urobilinogen, Ur 0.2 0.2 - 1.0 mg/dL   Nitrite, UA Negative Negative   Microscopic Examination See below:       Assessment & Plan:   Problem List Items Addressed This Visit      Other   Depression, recurrent ([place])   Anxiety  Other Visit Diagnoses    Annual physical exam    -  Primary       Follow up plan: No follow-ups on file.   LABORATORY TESTING:  - Pap smear: up to date  IMMUNIZATIONS:   - Tdap: Tetanus vaccination status reviewed: last tetanus booster within 10 years. - Influenza: Postponed to flu season - Pneumovax: Not applicable - Prevnar: Not applicable - HPV: Refused - Zostavax vaccine: Not applicable  SCREENING: -Mammogram: Not applicable  - Colonoscopy: Not applicable  - Bone Density: Not applicable  -Hearing Test: Not applicable  -Spirometry: Not applicable   PATIENT COUNSELING:   Advised to take 1 mg of folate supplement per day if capable of pregnancy.   Sexuality: Discussed sexually transmitted diseases, partner selection, use of condoms, avoidance of unintended pregnancy  and contraceptive alternatives.   Advised to avoid cigarette smoking.  I discussed with the patient that most people either abstain from alcohol or drink within safe limits (<=14/week and <=4 drinks/occasion for males, <=7/weeks and <= 3 drinks/occasion for females) and that the risk for alcohol disorders and other health effects rises proportionally with the number of drinks per week and how often a drinker exceeds daily limits.  Discussed cessation/primary prevention of drug use and availability of treatment for abuse.   Diet: Encouraged to adjust caloric intake to maintain  or achieve ideal body weight, to reduce  intake of dietary saturated fat and total fat, to limit sodium intake  by avoiding high sodium foods and not adding table salt, and to maintain adequate dietary potassium and calcium preferably from fresh fruits, vegetables, and low-fat dairy products.    stressed the importance of regular exercise  Injury prevention: Discussed safety belts, safety helmets, smoke detector, smoking near bedding or upholstery.   Dental health: Discussed importance of regular tooth brushing, flossing, and dental visits.    NEXT PREVENTATIVE PHYSICAL DUE IN 1 YEAR. No follow-ups on file.

## 2022-01-22 ENCOUNTER — Ambulatory Visit (INDEPENDENT_AMBULATORY_CARE_PROVIDER_SITE_OTHER): Payer: Medicaid Other | Admitting: Nurse Practitioner

## 2022-01-22 DIAGNOSIS — Z0000 Encounter for general adult medical examination without abnormal findings: Secondary | ICD-10-CM

## 2022-01-22 DIAGNOSIS — E7800 Pure hypercholesterolemia, unspecified: Secondary | ICD-10-CM

## 2022-01-22 DIAGNOSIS — F419 Anxiety disorder, unspecified: Secondary | ICD-10-CM

## 2022-01-22 DIAGNOSIS — F339 Major depressive disorder, recurrent, unspecified: Secondary | ICD-10-CM

## 2022-01-22 DIAGNOSIS — Z538 Procedure and treatment not carried out for other reasons: Secondary | ICD-10-CM

## 2022-01-23 NOTE — Progress Notes (Signed)
Patient no showed for appt.

## 2022-02-11 ENCOUNTER — Ambulatory Visit (INDEPENDENT_AMBULATORY_CARE_PROVIDER_SITE_OTHER): Payer: Medicaid Other | Admitting: Nurse Practitioner

## 2022-02-11 ENCOUNTER — Encounter: Payer: Self-pay | Admitting: Nurse Practitioner

## 2022-02-11 ENCOUNTER — Other Ambulatory Visit (HOSPITAL_COMMUNITY)
Admission: RE | Admit: 2022-02-11 | Discharge: 2022-02-11 | Disposition: A | Discharge status: Home / Self Care | Payer: Medicaid Other | Source: Ambulatory Visit | Attending: Nurse Practitioner | Admitting: Nurse Practitioner

## 2022-02-11 VITALS — BP 128/83 | HR 83 | Temp 98.5°F | Ht 62.7 in | Wt 170.6 lb

## 2022-02-11 DIAGNOSIS — Z113 Encounter for screening for infections with a predominantly sexual mode of transmission: Secondary | ICD-10-CM | POA: Diagnosis not present

## 2022-02-11 DIAGNOSIS — E7800 Pure hypercholesterolemia, unspecified: Secondary | ICD-10-CM

## 2022-02-11 DIAGNOSIS — F419 Anxiety disorder, unspecified: Secondary | ICD-10-CM

## 2022-02-11 DIAGNOSIS — Z23 Encounter for immunization: Secondary | ICD-10-CM | POA: Diagnosis not present

## 2022-02-11 DIAGNOSIS — F339 Major depressive disorder, recurrent, unspecified: Secondary | ICD-10-CM

## 2022-02-11 DIAGNOSIS — Z0000 Encounter for general adult medical examination without abnormal findings: Secondary | ICD-10-CM

## 2022-02-11 LAB — URINALYSIS, ROUTINE W REFLEX MICROSCOPIC
Bilirubin, UA: NEGATIVE
Glucose, UA: NEGATIVE
Ketones, UA: NEGATIVE
Leukocytes,UA: NEGATIVE
Nitrite, UA: NEGATIVE
Protein,UA: NEGATIVE
RBC, UA: NEGATIVE
Specific Gravity, UA: >1.03 — ABNORMAL HIGH (ref 1.005–1.030)
Urobilinogen, Ur: 0.2 mg/dL (ref 0.2–1.0)
pH, UA: 6 (ref 5.0–7.5)

## 2022-02-11 NOTE — Assessment & Plan Note (Signed)
Chronic.  Controlled without medication.  Labs ordered today.  Return to clinic in 1 year for reevaluation.  Call sooner if concerns arise.   

## 2022-02-11 NOTE — Assessment & Plan Note (Signed)
Labs ordered at visit today.  Will make recommendations based on lab results.   

## 2022-02-11 NOTE — Progress Notes (Signed)
BP 128/83   Pulse 83   Temp 98.5 F (36.9 C) (Oral)   Ht 5' 2.7" (1.593 m)   Wt 170 lb 9.6 oz (77.4 kg)   LMP 01/22/2022   SpO2 99%   BMI 30.51 kg/m    Subjective:    Patient ID: Karen Walton, female    DOB: 1985/08/15, 36 y.o.   MRN: [id number]  HPI: Karen Walton is a 36 y.o. female presenting on 02/11/2022 for comprehensive medical examination. Current medical complaints include:none  She currently lives with: Menopausal Symptoms: no   Denies HA, CP, SOB, dizziness, palpitations, visual changes, and lower extremity swelling.  MOOD Patient stopped taking the Zoloft.  States her mood has been improved.  Denies concerns during visit today.   Depression Screen done today and results listed below:     02/11/2022    3:24 PM 04/01/2021   11:24 AM 02/27/2021    3:58 PM 01/03/2021    9:03 AM 12/30/2019   10:32 AM  Depression screen PHQ 2/9  Decreased Interest _0 0 0  Down, Depressed, Hopeless 0 0 3 0 0  PHQ - 2 Score _1 0 0  Altered sleeping 0 0 2  0  Tired, decreased energy _2 Change in appetite 0 0 0  2  Feeling bad or failure about yourself  0 _3 Trouble concentrating 0 0 1  0  Moving slowly or fidgety/restless 0 0 1  0  Suicidal thoughts 0 0 0  0  PHQ-9 Score _4 Difficult doing work/chores Not difficult at all Not difficult at all Somewhat difficult      The patient does not have a history of falls. I did complete a risk assessment for falls. A plan of care for falls was documented.   Past Medical History:  Past Medical History:  Diagnosis Date   Allergy     Surgical History:  Past Surgical History:  Procedure Laterality Date   CHOLECYSTECTOMY  2014   TUBAL LIGATION      Medications:  No current outpatient medications on file prior to visit.   No current facility-administered medications on file prior to visit.    Allergies:  Allergies  Allergen Reactions   Flagyl [Metronidazole] Nausea And Vomiting    Social  History:  Social History   Socioeconomic History   Marital status: Single    Spouse name: Not on file   Number of children: Not on file   Years of education: Not on file   Highest education level: Not on file  Occupational History   Not on file  Tobacco Use   Smoking status: Never   Smokeless tobacco: Never  Vaping Use   Vaping Use: Never used  Substance and Sexual Activity   Alcohol use: Yes    Alcohol/week: 2.0 standard drinks of alcohol    Types: 2 Glasses of wine per week    Comment: Socially   Drug use: No   Sexual activity: Yes    Birth control/protection: Surgical  Other Topics Concern   Not on file  Social History Narrative   Not on file   Social Determinants of Health   Financial Resource Strain: Not on file  Food Insecurity: Not on file  Transportation Needs: Not on file  Physical Activity: Not on file  Stress: Not on file  Social Connections: Not on file  Intimate Partner Violence: Not on  file   Social History   Tobacco Use  Smoking Status Never  Smokeless Tobacco Never   Social History   Substance and Sexual Activity  Alcohol Use Yes   Alcohol/week: 2.0 standard drinks of alcohol   Types: 2 Glasses of wine per week   Comment: Socially    Family History:  Family History  Problem Relation Age of Onset   Cancer Paternal Grandfather        brain   Diabetes Mother    Hypertension Mother    Hyperlipidemia Sister    Hypertension Sister    Depression Maternal Grandmother    Osteoporosis Maternal Grandmother    Brain cancer Maternal Grandfather    Diabetes Paternal Grandmother    COPD Paternal Grandmother    Congestive Heart Failure Paternal Grandmother    Thyroid disease Neg Hx     Past medical history, surgical history, medications, allergies, family history and social history reviewed with patient today and changes made to appropriate areas of the chart.   Review of Systems  Eyes:  Negative for blurred vision and double vision.   Respiratory:  Negative for shortness of breath.   Cardiovascular:  Negative for chest pain, palpitations and leg swelling.  Neurological:  Negative for dizziness and headaches.  Psychiatric/Behavioral:  Negative for depression and suicidal ideas. The patient is not nervous/anxious.    All other ROS negative except what is listed above and in the HPI.      Objective:    BP 128/83   Pulse 83   Temp 98.5 F (36.9 C) (Oral)   Ht 5' 2.7" (1.593 m)   Wt 170 lb 9.6 oz (77.4 kg)   LMP 01/22/2022   SpO2 99%   BMI 30.51 kg/m   Wt Readings from Last 3 Encounters:  02/11/22 170 lb 9.6 oz (77.4 kg)  02/27/21 166 lb (75.3 kg)  01/03/21 165 lb 8 oz (75.1 kg)    Physical Exam Vitals and nursing note reviewed. Exam conducted with a chaperone present [facility] [place], [place]).  Constitutional:      General: She is awake. She is not in acute distress.    Appearance: Normal appearance. She is well-developed and normal weight. She is not ill-appearing.  HENT:     Head: Normocephalic and atraumatic.     Right Ear: Hearing, tympanic membrane, ear canal and external ear normal. No drainage.     Left Ear: Hearing, tympanic membrane, ear canal and external ear normal. No drainage.     Nose: Nose normal.     Right Sinus: No maxillary sinus tenderness or frontal sinus tenderness.     Left Sinus: No maxillary sinus tenderness or frontal sinus tenderness.     Mouth/Throat:     Mouth: Mucous membranes are moist.     Pharynx: Oropharynx is clear. Uvula midline. No pharyngeal swelling, oropharyngeal exudate or posterior oropharyngeal erythema.  Eyes:     General: Lids are normal.        Right eye: No discharge.        Left eye: No discharge.     Extraocular Movements: Extraocular movements intact.     Conjunctiva/sclera: Conjunctivae normal.     Pupils: Pupils are equal, round, and reactive to light.     Visual Fields: Right eye visual fields normal and left eye visual fields normal.  Neck:     Thyroid:  No thyromegaly.     Vascular: No carotid bruit.     Trachea: Trachea normal.  Cardiovascular:  Rate and Rhythm: Normal rate and regular rhythm.     Heart sounds: Normal heart sounds. No murmur heard.    No gallop.  Pulmonary:     Effort: Pulmonary effort is normal. No accessory muscle usage or respiratory distress.     Breath sounds: Normal breath sounds.  Chest:  Breasts:    Right: Normal.     Left: Normal.  Abdominal:     General: Bowel sounds are normal.     Palpations: Abdomen is soft. There is no hepatomegaly or splenomegaly.     Tenderness: There is no abdominal tenderness.  Genitourinary:    Vagina: Normal.     Cervix: Normal.     Adnexa: Right adnexa normal and left adnexa normal.  Musculoskeletal:        General: Normal range of motion.     Cervical back: Normal range of motion and neck supple.     Right lower leg: No edema.     Left lower leg: No edema.  Lymphadenopathy:     Head:     Right side of head: No submental, submandibular, tonsillar, preauricular or posterior auricular adenopathy.     Left side of head: No submental, submandibular, tonsillar, preauricular or posterior auricular adenopathy.     Cervical: No cervical adenopathy.     Upper Body:     Right upper body: No supraclavicular, axillary or pectoral adenopathy.     Left upper body: No supraclavicular, axillary or pectoral adenopathy.  Skin:    General: Skin is warm and dry.     Capillary Refill: Capillary refill takes less than 2 seconds.     Findings: No rash.  Neurological:     Mental Status: She is alert and oriented to person, place, and time.     Gait: Gait is intact.     Deep Tendon Reflexes: Reflexes are normal and symmetric.     Reflex Scores:      Brachioradialis reflexes are 2+ on the right side and 2+ on the left side.      Patellar reflexes are 2+ on the right side and 2+ on the left side. Psychiatric:        Attention and Perception: Attention normal.        Mood and Affect:  Mood normal.        Speech: Speech normal.        Behavior: Behavior normal. Behavior is cooperative.        Thought Content: Thought content normal.        Judgment: Judgment normal.     Results for orders placed or performed in visit on 01/03/21  Microscopic Examination   Urine  Result Value Ref Range   WBC, UA 0-5 0 - 5 /hpf   RBC, Urine None seen 0 - 2 /hpf   Epithelial Cells (non renal) 0-10 0 - 10 /hpf   Mucus, UA Present (A) Not Estab.   Bacteria, UA Moderate (A) None seen/Few  Urine Culture   Specimen: Urine   UR  Result Value Ref Range   Urine Culture, Routine Final report    Organism ID, Bacteria Comment   CBC with Differential/Platelet  Result Value Ref Range   WBC 5.1 3.4 - 10.8 x10E3/uL   RBC 3.93 3.77 - 5.28 x10E6/uL   Hemoglobin 9.5 (L) 11.1 - 15.9 g/dL   Hematocrit 31.7 (L) 34.0 - 46.6 %   MCV 81 79 - 97 fL   MCH 24.2 (L) 26.6 - 33.0 pg  MCHC 30.0 (L) 31.5 - 35.7 g/dL   RDW 16.0 (H) 11.7 - 15.4 %   Platelets 342 150 - 450 x10E3/uL   Neutrophils 53 Not Estab. %   Lymphs 35 Not Estab. %   Monocytes 9 Not Estab. %   Eos 1 Not Estab. %   Basos 2 Not Estab. %   Neutrophils Absolute 2.7 1.4 - 7.0 x10E3/uL   Lymphocytes Absolute 1.8 0.7 - 3.1 x10E3/uL   Monocytes Absolute 0.5 0.1 - 0.9 x10E3/uL   EOS (ABSOLUTE) 0.1 0.0 - 0.4 x10E3/uL   Basophils Absolute 0.1 0.0 - 0.2 x10E3/uL   Immature Granulocytes 0 Not Estab. %   Immature Grans (Abs) 0.0 0.0 - 0.1 x10E3/uL  Comprehensive metabolic panel  Result Value Ref Range   Glucose 77 65 - 99 mg/dL   BUN 7 6 - 20 mg/dL   Creatinine, Ser 0.67 0.57 - 1.00 mg/dL   eGFR 118 >59 mL/min/1.73   BUN/Creatinine Ratio 10 9 - 23   Sodium 138 134 - 144 mmol/L   Potassium 4.3 3.5 - 5.2 mmol/L   Chloride 103 96 - 106 mmol/L   CO2 22 20 - 29 mmol/L   Calcium 9.0 8.7 - 10.2 mg/dL   Total Protein 7.1 6.0 - 8.5 g/dL   Albumin 4.1 3.8 - 4.8 g/dL   Globulin, Total 3.0 1.5 - 4.5 g/dL   Albumin/Globulin Ratio 1.4 1.2 - 2.2    Bilirubin Total 0.6 0.0 - 1.2 mg/dL   Alkaline Phosphatase 64 44 - 121 IU/L   AST 21 0 - 40 IU/L   ALT 11 0 - 32 IU/L  Lipid panel  Result Value Ref Range   Cholesterol, Total 187 100 - 199 mg/dL   Triglycerides 78 0 - 149 mg/dL   HDL 58 >39 mg/dL   VLDL Cholesterol Cal 14 5 - 40 mg/dL   LDL Chol Calc (NIH) 115 (H) 0 - 99 mg/dL   Chol/HDL Ratio 3.2 0.0 - 4.4 ratio  TSH  Result Value Ref Range   TSH 1.470 0.450 - 4.500 uIU/mL  Urinalysis, Routine w reflex microscopic  Result Value Ref Range   Specific Gravity, UA 1.020 1.005 - 1.030   pH, UA 6.5 5.0 - 7.5   Color, UA Yellow Yellow   Appearance Ur Cloudy (A) Clear   Leukocytes,UA 1+ (A) Negative   Protein,UA Negative Negative/Trace   Glucose, UA Negative Negative   Ketones, UA Negative Negative   RBC, UA Negative Negative   Bilirubin, UA Negative Negative   Urobilinogen, Ur 0.2 0.2 - 1.0 mg/dL   Nitrite, UA Negative Negative   Microscopic Examination See below:       Assessment & Plan:   Problem List Items Addressed This Visit       Other   Depression, recurrent (HCC)    Chronic.  Controlled without medication..  Labs ordered today.  Return to clinic in 1 year for reevaluation.  Call sooner if concerns arise.        Anxiety    Chronic.  Controlled without medication..  Labs ordered today.  Return to clinic in 1 year for reevaluation.  Call sooner if concerns arise.        Elevated LDL cholesterol level    Labs ordered at visit today.  Will make recommendations based on lab results.        Relevant Orders   Lipid panel   Other Visit Diagnoses     Annual physical exam    -  Primary   Health maintenance reviewed during visit today.  Labs ordered. PAP done.  Flu shot given.  Tetanus up to date.   Relevant Orders   CBC with Differential/Platelet   Comprehensive metabolic panel   Lipid panel   TSH   Urinalysis, Routine w reflex microscopic   Cytology - PAP   Routine screening for STI (sexually transmitted  infection)       Not currenlty experiencing symtoms.  Would like to be screened at visit today.   Relevant Orders   HIV Antibody (routine testing w rflx)   RPR   HSV(herpes simplex vrs) 1+2 ab-IgG   Hepatitis C antibody   Chlamydia/Gonococcus/Trichomonas, NAA   Need for influenza vaccination       Relevant Orders   Flu Vaccine QUAD 6+ mos PF IM (Fluarix Quad PF)        Follow up plan: Return in about 1 year (around 02/12/2023) for Physical and Fasting labs.   LABORATORY TESTING:  - Pap smear: Done today  IMMUNIZATIONS:   - Tdap: Tetanus vaccination status reviewed: last tetanus booster within 10 years. - Influenza: Postponed to flu season - Pneumovax: Not applicable - Prevnar: Not applicable - HPV: Refused - Zostavax vaccine: Not applicable  SCREENING: -Mammogram: Not applicable  - Colonoscopy: Not applicable  - Bone Density: Not applicable  -Hearing Test: Not applicable  -Spirometry: Not applicable   PATIENT COUNSELING:   Advised to take 1 mg of folate supplement per day if capable of pregnancy.   Sexuality: Discussed sexually transmitted diseases, partner selection, use of condoms, avoidance of unintended pregnancy  and contraceptive alternatives.   Advised to avoid cigarette smoking.  I discussed with the patient that most people either abstain from alcohol or drink within safe limits (<=14/week and <=4 drinks/occasion for males, <=7/weeks and <= 3 drinks/occasion for females) and that the risk for alcohol disorders and other health effects rises proportionally with the number of drinks per week and how often a drinker exceeds daily limits.  Discussed cessation/primary prevention of drug use and availability of treatment for abuse.   Diet: Encouraged to adjust caloric intake to maintain  or achieve ideal body weight, to reduce intake of dietary saturated fat and total fat, to limit sodium intake by avoiding high sodium foods and not adding table salt, and to maintain  adequate dietary potassium and calcium preferably from fresh fruits, vegetables, and low-fat dairy products.    stressed the importance of regular exercise  Injury prevention: Discussed safety belts, safety helmets, smoke detector, smoking near bedding or upholstery.   Dental health: Discussed importance of regular tooth brushing, flossing, and dental visits.    NEXT PREVENTATIVE PHYSICAL DUE IN 1 YEAR. Return in about 1 year (around 02/12/2023) for Physical and Fasting labs.

## 2022-02-12 LAB — CBC WITH DIFFERENTIAL/PLATELET
Basophils Absolute: 0.1 10*3/uL (ref 0.0–0.2)
Basos: 1 %
EOS (ABSOLUTE): 0.1 10*3/uL (ref 0.0–0.4)
Eos: 2 %
Hematocrit: 26.7 % — ABNORMAL LOW (ref 34.0–46.6)
Hemoglobin: 8 g/dL — ABNORMAL LOW (ref 11.1–15.9)
Immature Grans (Abs): 0 10*3/uL (ref 0.0–0.1)
Immature Granulocytes: 0 %
Lymphocytes Absolute: 2 10*3/uL (ref 0.7–3.1)
Lymphs: 36 %
MCH: 21.2 pg — ABNORMAL LOW (ref 26.6–33.0)
MCHC: 30 g/dL — ABNORMAL LOW (ref 31.5–35.7)
MCV: 71 fL — ABNORMAL LOW (ref 79–97)
Monocytes Absolute: 0.5 10*3/uL (ref 0.1–0.9)
Monocytes: 8 %
Neutrophils Absolute: 3 10*3/uL (ref 1.4–7.0)
Neutrophils: 53 %
Platelets: 330 10*3/uL (ref 150–450)
RBC: 3.77 x10E6/uL (ref 3.77–5.28)
RDW: 20.2 % — ABNORMAL HIGH (ref 11.7–15.4)
WBC: 5.6 10*3/uL (ref 3.4–10.8)

## 2022-02-12 LAB — LIPID PANEL
Chol/HDL Ratio: 3.5 ratio (ref 0.0–4.4)
Cholesterol, Total: 190 mg/dL (ref 100–199)
HDL: 55 mg/dL (ref >39)
LDL Chol Calc (NIH): 116 mg/dL — ABNORMAL HIGH (ref 0–99)
Triglycerides: 108 mg/dL (ref 0–149)
VLDL Cholesterol Cal: 19 mg/dL (ref 5–40)

## 2022-02-12 LAB — COMPREHENSIVE METABOLIC PANEL
ALT: 16 IU/L (ref 0–32)
AST: 23 IU/L (ref 0–40)
Albumin/Globulin Ratio: 1.5 (ref 1.2–2.2)
Albumin: 4.3 g/dL (ref 3.9–4.9)
Alkaline Phosphatase: 69 IU/L (ref 44–121)
BUN/Creatinine Ratio: 11 (ref 9–23)
BUN: 8 mg/dL (ref 6–20)
Bilirubin Total: 0.3 mg/dL (ref 0.0–1.2)
CO2: 20 mmol/L (ref 20–29)
Calcium: 9.1 mg/dL (ref 8.7–10.2)
Chloride: 102 mmol/L (ref 96–106)
Creatinine, Ser: 0.75 mg/dL (ref 0.57–1.00)
Globulin, Total: 2.9 g/dL (ref 1.5–4.5)
Glucose: 77 mg/dL (ref 70–99)
Potassium: 3.7 mmol/L (ref 3.5–5.2)
Sodium: 136 mmol/L (ref 134–144)
Total Protein: 7.2 g/dL (ref 6.0–8.5)
eGFR: 106 mL/min/{1.73_m2} (ref >59)

## 2022-02-12 LAB — HSV 1 AND 2 AB, IGG
HSV 1 Glycoprotein G Ab, IgG: 53.4 index — ABNORMAL HIGH (ref 0.00–0.90)
HSV 2 IgG, Type Spec: <0.91 index (ref 0.00–0.90)

## 2022-02-12 LAB — HIV ANTIBODY (ROUTINE TESTING W REFLEX): HIV Screen 4th Generation wRfx: NONREACTIVE

## 2022-02-12 LAB — HEPATITIS C ANTIBODY: Hep C Virus Ab: NONREACTIVE

## 2022-02-12 LAB — RPR: RPR Ser Ql: NONREACTIVE

## 2022-02-12 LAB — TSH: TSH: 1.04 u[IU]/mL (ref 0.450–4.500)

## 2022-02-12 NOTE — Progress Notes (Signed)
Please let patient know that her lab work shows that her anemia has worsened.  I recommend she start Ferrous Sulfate 65mg  daily.  Take it with orange juice.  It may cause constipation, if so, take colace with it.  Please schedule her an appt to see me in 6 weeks so we can recheck this.  Other lab work looks good.  No other concerns at this time.

## 2022-02-14 LAB — CHLAMYDIA/GONOCOCCUS/TRICHOMONAS, NAA
Chlamydia by NAA: NEGATIVE
Gonococcus by NAA: NEGATIVE
Trich vag by NAA: NEGATIVE

## 2022-02-18 ENCOUNTER — Encounter: Payer: Self-pay | Admitting: Nurse Practitioner

## 2022-02-18 LAB — CYTOLOGY - PAP
Comment: NEGATIVE
Diagnosis: UNDETERMINED — AB
High risk HPV: NEGATIVE

## 2022-02-18 MED ORDER — FLUCONAZOLE 150 MG PO TABS: 150.0000 mg | ORAL_TABLET | Freq: Once | ORAL | 0 refills | Status: AC

## 2022-02-18 NOTE — Progress Notes (Signed)
Please let patient know that her PAP was slightly abnormal. We will repeat it next year and if it is still abnormal I will recommend she see GYN.  Her PAP was positive for yeast.  I have sent in a prescription for Diflucan to treat this.

## 2022-02-18 NOTE — Addendum Note (Signed)
Addended by: Jon Billings on: 02/18/2022 12:57 PM   Modules accepted: Orders

## 2022-03-22 ENCOUNTER — Encounter: Payer: Self-pay | Admitting: Nurse Practitioner

## 2022-03-22 DIAGNOSIS — Z021 Encounter for pre-employment examination: Secondary | ICD-10-CM

## 2022-03-25 ENCOUNTER — Other Ambulatory Visit: Payer: Medicaid Other

## 2022-03-25 DIAGNOSIS — Z021 Encounter for pre-employment examination: Secondary | ICD-10-CM | POA: Diagnosis not present

## 2022-03-26 ENCOUNTER — Other Ambulatory Visit: Payer: Medicaid Other

## 2022-03-28 LAB — QUANTIFERON-TB GOLD PLUS
QuantiFERON Mitogen Value: 4.38 IU/mL
QuantiFERON Nil Value: 0 IU/mL
QuantiFERON TB1 Ag Value: 0 IU/mL
QuantiFERON TB2 Ag Value: 0 IU/mL
QuantiFERON-TB Gold Plus: NEGATIVE

## 2022-03-28 LAB — HEPATITIS B SURFACE ANTIBODY, QUANTITATIVE: Hepatitis B Surf Ab Quant: 472.5 m[IU]/mL (ref >9.9)

## 2022-03-28 LAB — MEASLES/MUMPS/RUBELLA IMMUNITY
MUMPS ABS, IGG: 36.4 AU/mL (ref >10.9)
RUBEOLA AB, IGG: 93.9 AU/mL (ref >16.4)
Rubella Antibodies, IGG: 3.14 index (ref >0.99)

## 2022-03-28 LAB — VARICELLA ZOSTER ABS, IGG/IGM
Varicella IgM: 1.36 index — ABNORMAL HIGH (ref 0.00–0.90)
Varicella zoster IgG: 1371 index (ref >165)

## 2022-03-28 NOTE — Progress Notes (Signed)
Hi Karen Walton.  Your lab work looks good.  Your are immune to varicella, Hep B and MMR.  Your quantiferon gold is negative.

## 2022-05-30 ENCOUNTER — Ambulatory Visit: Payer: Medicaid Other | Admitting: Physician Assistant

## 2022-05-30 ENCOUNTER — Encounter: Payer: Self-pay | Admitting: Physician Assistant

## 2022-05-30 VITALS — BP 116/74 | HR 93 | Temp 99.0°F | Ht 62.7 in | Wt 169.6 lb

## 2022-05-30 DIAGNOSIS — G500 Trigeminal neuralgia: Secondary | ICD-10-CM | POA: Diagnosis not present

## 2022-05-30 DIAGNOSIS — H6591 Unspecified nonsuppurative otitis media, right ear: Secondary | ICD-10-CM | POA: Diagnosis not present

## 2022-05-30 MED ORDER — PREDNISONE 20 MG PO TABS: ORAL_TABLET | ORAL | 0 refills | Status: DC

## 2022-05-30 MED ORDER — METHOCARBAMOL 750 MG PO TABS: 750.0000 mg | ORAL_TABLET | Freq: Three times a day (TID) | ORAL | 0 refills | Status: DC | PRN

## 2022-05-30 NOTE — Progress Notes (Unsigned)
Acute Office Visit   Patient: Karen Walton   DOB: July 13, 1985   37 y.o. Female  MRN: [id number] Visit Date: 05/30/2022  Today's healthcare provider: Dani Gobble Deshannon Seide, PA-C  Introduced myself to the patient as a Journalist, newspaper and provided education on APPs in clinical practice.    Chief Complaint  Patient presents with   Headache   Otalgia    Patient says since Monday she has been experiencing an pain in her R ear and she is also experiencing a burning in her face. Patient says she has been taking Tylenol and it helps takes the edge off. Patient says the pain is consistent.    Subjective    Headache  Associated symptoms include ear pain. Pertinent negatives include no eye pain, fever, numbness or weakness.  Otalgia  Associated symptoms include headaches.   HPI     Otalgia    Additional comments: Patient says since Monday she has been experiencing an pain in her R ear and she is also experiencing a burning in her face. Patient says she has been taking Tylenol and it helps takes the edge off. Patient says the pain is consistent.       Last edited by Irena Reichmann, [place] on 05/30/2022  3:28 PM.       Right Ear pain  Onset: sudden for ear pain and gradual for burning  Duration:since Monday  Associated Symptoms: reports right side of face has a burning sensation and she has been having a headache as well  Reports right sided pain is under eye, along temple and eyebrow and down to chin Interventions:Tylenol She reports she is recovering from URI- type illness a few weeks ago She reports pain with moving her jaw and chewing     Medications: No outpatient medications prior to visit.   No facility-administered medications prior to visit.    Review of Systems  Constitutional:  Positive for chills. Negative for fatigue and fever.  HENT:  Positive for ear pain.   Eyes:  Negative for pain.  Neurological:  Positive for headaches. Negative for speech difficulty, weakness and  numbness.    {Labs  Heme  Chem  Endocrine  Serology  Results Review (optional):23779}   Objective    BP 116/74   Pulse 93   Temp 99 F (37.2 C) (Oral)   Ht 5' 2.7" (1.593 m)   Wt 169 lb 9.6 oz (76.9 kg)   SpO2 100%   BMI 30.33 kg/m  {Show previous vital signs (optional):23777}  Physical Exam Vitals reviewed.  Constitutional:      General: She is awake.     Appearance: Normal appearance. She is well-developed and well-groomed.  HENT:     Head: Normocephalic and atraumatic.     Right Ear: Hearing and ear canal normal. A middle ear effusion is present. Tympanic membrane is erythematous. Tympanic membrane is not injected, scarred, perforated, retracted or bulging.     Left Ear: Hearing, tympanic membrane and ear canal normal. Tympanic membrane is not injected, scarred, perforated, erythematous, retracted or bulging.     Mouth/Throat:     Lips: Pink.     Mouth: Mucous membranes are moist.     Pharynx: Oropharynx is clear. Uvula midline.     Comments: Reports pain along trigeminal nerve distribution of face and jaw is sensitive to palpation near right ear  Eyes:     General: Lids are normal. Gaze aligned appropriately.     Extraocular  Movements: Extraocular movements intact.     Conjunctiva/sclera: Conjunctivae normal.  Pulmonary:     Effort: Pulmonary effort is normal.  Musculoskeletal:     Cervical back: Normal range of motion and neck supple.  Lymphadenopathy:     Head:     Right side of head: No submental, submandibular, preauricular or posterior auricular adenopathy.     Left side of head: No submental, submandibular, preauricular or posterior auricular adenopathy.     Cervical:     Right cervical: No superficial or posterior cervical adenopathy.    Left cervical: No superficial or posterior cervical adenopathy.  Neurological:     Mental Status: She is alert and oriented to person, place, and time. Mental status is at baseline.     GCS: GCS eye subscore is 4. GCS  verbal subscore is 5. GCS motor subscore is 6.     Cranial Nerves: Cranial nerves 2-12 are intact. No cranial nerve deficit, dysarthria or facial asymmetry.  Psychiatric:        Behavior: Behavior is cooperative.       No results found for any visits on 05/30/22.  Assessment & Plan      No follow-ups on file.       Problem List Items Addressed This Visit   None Visit Diagnoses     Trigeminal neuralgia of right side of face    -  Primary   Relevant Medications   predniSONE (DELTASONE) 20 MG tablet   methocarbamol (ROBAXIN-750) 750 MG tablet        No follow-ups on file.   I, Kionte Baumgardner E Tirso Laws, PA-C, have reviewed all documentation for this visit. The documentation on 05/30/22 for the exam, diagnosis, procedures, and orders are all accurate and complete.   Talitha Givens, MHS, PA-C [facility]

## 2022-12-08 ENCOUNTER — Encounter: Payer: Self-pay | Admitting: Physician Assistant

## 2022-12-08 DIAGNOSIS — Z87898 Personal history of other specified conditions: Secondary | ICD-10-CM

## 2022-12-08 MED ORDER — ONDANSETRON HCL 4 MG PO TABS: 4.0000 mg | ORAL_TABLET | Freq: Three times a day (TID) | ORAL | 0 refills | Status: DC | PRN

## 2022-12-10 ENCOUNTER — Encounter: Payer: Self-pay | Admitting: Nurse Practitioner

## 2023-01-25 DIAGNOSIS — Z9289 Personal history of other medical treatment: Secondary | ICD-10-CM

## 2023-01-25 HISTORY — DX: Personal history of other medical treatment: Z92.89

## 2023-02-13 ENCOUNTER — Encounter: Payer: BC Managed Care – PPO | Admitting: Physician Assistant

## 2023-02-19 ENCOUNTER — Encounter: Payer: Self-pay | Admitting: Physician Assistant

## 2023-02-19 ENCOUNTER — Ambulatory Visit (INDEPENDENT_AMBULATORY_CARE_PROVIDER_SITE_OTHER): Payer: BC Managed Care – PPO | Admitting: Physician Assistant

## 2023-02-19 VITALS — BP 121/71 | HR 91 | Temp 98.1°F | Ht 62.0 in | Wt 160.0 lb

## 2023-02-19 DIAGNOSIS — Z23 Encounter for immunization: Secondary | ICD-10-CM | POA: Diagnosis not present

## 2023-02-19 DIAGNOSIS — E7800 Pure hypercholesterolemia, unspecified: Secondary | ICD-10-CM

## 2023-02-19 DIAGNOSIS — Z0000 Encounter for general adult medical examination without abnormal findings: Secondary | ICD-10-CM

## 2023-02-19 DIAGNOSIS — Z113 Encounter for screening for infections with a predominantly sexual mode of transmission: Secondary | ICD-10-CM

## 2023-02-19 DIAGNOSIS — Z7189 Other specified counseling: Secondary | ICD-10-CM | POA: Insufficient documentation

## 2023-02-19 NOTE — Progress Notes (Signed)
Annual Physical Exam   Name: Karen Walton   MRN: [id number]    DOB: 26-Apr-1986   Date:02/19/2023  Today's Provider: Jacquelin Hawking, MHS, PA-C Introduced myself to the patient as a PA-C and provided education on APPs in clinical practice.         Subjective  Chief Complaint  Chief Complaint  Patient presents with   Annual Exam    HPI  Patient presents for annual CPE.  Diet: She does not think her diet is well balanced  Exercise: She is exercising 1-2 days per week with treadmill for 30-40 minutes and stairmaster for about 25 minutes   Sleep:Sleep is good, getting about 6-8 hours per night, feels well rested in the AM  Mood: "it could use improvement, I have good and bad days" she reports more anxiety concerns- states she worries something bad will happen or she is unable to control situations    Depression: Phq 9 is  negative    02/19/2023    8:41 AM 05/30/2022    3:31 PM 02/11/2022    3:24 PM 04/01/2021   11:24 AM 02/27/2021    3:58 PM  Depression screen PHQ 2/9  Decreased Interest 0 0 1 1 3   Down, Depressed, Hopeless 1 0 0 0 3  PHQ - 2 Score 1 0 1 1 6   Altered sleeping 0 0 0 0 2  Tired, decreased energy 0 0 1 1 3   Change in appetite 0 0 0 0 0  Feeling bad or failure about yourself  0 0 0 1 1  Trouble concentrating 0 0 0 0 1  Moving slowly or fidgety/restless 0 0 0 0 1  Suicidal thoughts 0 0 0 0 0  PHQ-9 Score 1 0 2 3 14   Difficult doing work/chores Not difficult at all Not difficult at all Not difficult at all Not difficult at all Somewhat difficult   Hypertension: BP Readings from Last 3 Encounters:  02/19/23 121/71  05/30/22 116/74  02/11/22 128/83   Obesity: Wt Readings from Last 3 Encounters:  02/19/23 160 lb (72.6 kg)  05/30/22 169 lb 9.6 oz (76.9 kg)  02/11/22 170 lb 9.6 oz (77.4 kg)   BMI Readings from Last 3 Encounters:  02/19/23 29.26 kg/m  05/30/22 30.33 kg/m  02/11/22 30.51 kg/m     Vaccines:   HPV:  aged out  Tdap:  UTD Shingrix: NA Pneumonia: NA Flu: due at this time  COVID-19: Discussed vaccine and booster recommendations per available CDC guidelines     Hep C Screening: completed HIV screening: completed STD testing and prevention (HIV/chl/gon/syphilis): requests testing today  Intimate partner violence: negative Sexual History: she is sexually active with single female monogamous partner  Menstrual History/LMP/Abnormal Bleeding: 02/16/23 having regular periods  Discussed importance of follow up if any post-menopausal bleeding: not applicable Incontinence Symptoms: No.  Breast cancer hx:  - Last Mammogram: She denies known family hx- recommend routine screening at age 37 - BRCA gene screening:   Osteoporosis Prevention : Discussed high calcium and vitamin D supplementation, weight bearing exercises Bone density :not applicable  Cervical cancer screening: ASCUS in 2023 - HPV negative, she is spotting today so she will come back for PAP   Skin cancer: Discussed monitoring for atypical lesions  Colorectal cancer screening: She denies known family hx- recommend routine screening at age 52    Lung cancer:  Low Dose CT Chest recommended if Age 74-80 years, 20 pack-year currently smoking OR have  quit w/in 15years. Patient does not qualify.   ECG: NA  Advanced Care Planning: A voluntary discussion about advance care planning including the explanation and discussion of advance directives.  Discussed health care proxy and Living will, and the patient was able to identify a health care proxy as no one.  Patient does not have a living will in effect.  Lipids: Lab Results  Component Value Date   CHOL 190 02/11/2022   CHOL 187 01/03/2021   CHOL 177 12/30/2019   Lab Results  Component Value Date   HDL 55 02/11/2022   HDL 58 01/03/2021   HDL 60 12/30/2019   Lab Results  Component Value Date   LDLCALC 116 (H) 02/11/2022   LDLCALC 115 (H) 01/03/2021   LDLCALC 101 (H) 12/30/2019   Lab Results   Component Value Date   TRIG 108 02/11/2022   TRIG 78 01/03/2021   TRIG 86 12/30/2019   Lab Results  Component Value Date   CHOLHDL 3.5 02/11/2022   CHOLHDL 3.2 01/03/2021   No results found for: "LDLDIRECT"  Glucose: Glucose  Date Value Ref Range Status  02/11/2022 77 70 - 99 mg/dL Final  69/62/9528 77 65 - 99 mg/dL Final  41/32/4401 84 65 - 99 mg/dL Final  02/72/5366 440 (H) 65 - 99 mg/dL Final  34/74/2595 72 65 - 99 mg/dL Final    Patient Active Problem List   Diagnosis Date Noted   Advanced care planning/counseling discussion 02/19/2023   Elevated LDL cholesterol level 02/11/2022   Depression, recurrent (HCC) 02/28/2021   Anxiety 02/28/2021   Allergic rhinitis 08/24/2017    Past Surgical History:  Procedure Laterality Date   CHOLECYSTECTOMY  2014   TUBAL LIGATION      Family History  Problem Relation Age of Onset   Cancer Paternal Grandfather        brain   Diabetes Mother    Hypertension Mother    Hyperlipidemia Sister    Hypertension Sister    Depression Maternal Grandmother    Osteoporosis Maternal Grandmother    Brain cancer Maternal Grandfather    Diabetes Paternal Grandmother    COPD Paternal Grandmother    Congestive Heart Failure Paternal Grandmother    Thyroid disease Neg Hx     Social History   Socioeconomic History   Marital status: Single    Spouse name: Not on file   Number of children: Not on file   Years of education: Not on file   Highest education level: Not on file  Occupational History   Not on file  Tobacco Use   Smoking status: Never   Smokeless tobacco: Never  Vaping Use   Vaping status: Never Used  Substance and Sexual Activity   Alcohol use: Yes    Alcohol/week: 2.0 standard drinks of alcohol    Types: 2 Glasses of wine per week    Comment: Socially   Drug use: No   Sexual activity: Yes    Birth control/protection: Surgical  Other Topics Concern   Not on file  Social History Narrative   Not on file   Social  Determinants of Health   Financial Resource Strain: Not on file  Food Insecurity: Not on file  Transportation Needs: Not on file  Physical Activity: Not on file  Stress: Not on file  Social Connections: Not on file  Intimate Partner Violence: Not on file     Current Outpatient Medications:    methocarbamol (ROBAXIN-750) 750 MG tablet, Take 1 tablet (750 mg  total) by mouth every 8 (eight) hours as needed for muscle spasms. (Patient not taking: Reported on 02/19/2023), Disp: 30 tablet, Rfl: 0   ondansetron (ZOFRAN) 4 MG tablet, Take 1 tablet (4 mg total) by mouth every 8 (eight) hours as needed for nausea or vomiting. (Patient not taking: Reported on 02/19/2023), Disp: 10 tablet, Rfl: 0   predniSONE (DELTASONE) 20 MG tablet, Take 60mg  PO daily x 2 days, then40mg  PO daily x 2 days, then 20mg  PO daily x 3 days (Patient not taking: Reported on 02/19/2023), Disp: 13 tablet, Rfl: 0  Allergies  Allergen Reactions   Flagyl [Metronidazole] Nausea And Vomiting     Review of Systems  Constitutional:  Positive for diaphoresis. Negative for chills, fever and weight loss.  HENT:  Negative for congestion, hearing loss, nosebleeds, sore throat and tinnitus.   Eyes:  Negative for blurred vision, double vision and photophobia.  Respiratory:  Negative for cough, shortness of breath and wheezing.   Cardiovascular:  Negative for chest pain, palpitations and leg swelling.  Gastrointestinal:  Positive for constipation. Negative for blood in stool, diarrhea, heartburn, nausea and vomiting.  Genitourinary:  Negative for dysuria, frequency and hematuria.  Musculoskeletal:  Negative for falls, joint pain and myalgias.  Skin:  Negative for itching and rash.  Neurological:  Negative for dizziness, tingling, tremors, loss of consciousness, weakness and headaches.  Psychiatric/Behavioral:  Negative for memory loss, substance abuse and suicidal ideas. The patient is nervous/anxious. The patient does not have insomnia.         Mood concerns        Objective  Vitals:   02/19/23 0835  BP: 121/71  Pulse: 91  Temp: 98.1 F (36.7 C)  TempSrc: Oral  SpO2: 100%  Weight: 160 lb (72.6 kg)  Height: 5\' 2"  (1.575 m)    Body mass index is 29.26 kg/m.  Physical Exam Vitals reviewed.  Constitutional:      General: She is awake.     Appearance: Normal appearance. She is well-developed and well-groomed.  HENT:     Head: Normocephalic and atraumatic.     Right Ear: Hearing, tympanic membrane and ear canal normal.     Left Ear: Hearing, tympanic membrane and ear canal normal.     Mouth/Throat:     Lips: Pink.     Mouth: Mucous membranes are moist.     Pharynx: Oropharynx is clear. Uvula midline. No pharyngeal swelling, posterior oropharyngeal erythema, uvula swelling or postnasal drip.  Eyes:     General: Lids are normal. Gaze aligned appropriately.     Extraocular Movements: Extraocular movements intact.     Conjunctiva/sclera: Conjunctivae normal.     Pupils: Pupils are equal, round, and reactive to light.  Neck:     Thyroid: No thyroid mass, thyromegaly or thyroid tenderness.  Cardiovascular:     Rate and Rhythm: Normal rate and regular rhythm.     Pulses: Normal pulses.          Radial pulses are 2+ on the right side and 2+ on the left side.       Posterior tibial pulses are 2+ on the right side and 2+ on the left side.     Heart sounds: Normal heart sounds. No murmur heard.    No friction rub. No gallop.  Pulmonary:     Effort: Pulmonary effort is normal.     Breath sounds: Normal breath sounds. No decreased air movement. No decreased breath sounds, wheezing, rhonchi or rales.  Abdominal:  General: Abdomen is flat. Bowel sounds are normal.     Palpations: Abdomen is soft.     Tenderness: There is no abdominal tenderness.  Musculoskeletal:     Cervical back: Normal range of motion and neck supple.     Right lower leg: No edema.     Left lower leg: No edema.  Lymphadenopathy:      Head:     Right side of head: No submental or submandibular adenopathy.     Left side of head: No submental or submandibular adenopathy.     Cervical:     Right cervical: No superficial cervical adenopathy.    Left cervical: No superficial cervical adenopathy.     Upper Body:     Right upper body: No supraclavicular adenopathy.     Left upper body: No supraclavicular adenopathy.  Skin:    General: Skin is warm and dry.  Neurological:     General: No focal deficit present.     Mental Status: She is alert and oriented to person, place, and time.     GCS: GCS eye subscore is 4. GCS verbal subscore is 5. GCS motor subscore is 6.     Cranial Nerves: No cranial nerve deficit, dysarthria or facial asymmetry.     Motor: No weakness, tremor, atrophy or abnormal muscle tone.     Gait: Gait is intact.  Psychiatric:        Attention and Perception: Attention and perception normal.        Mood and Affect: Mood and affect normal.        Speech: Speech normal.        Behavior: Behavior normal. Behavior is cooperative.        Thought Content: Thought content normal.        Cognition and Memory: Cognition and memory normal.        Judgment: Judgment normal.      No results found for this or any previous visit (from the past 2160 hour(s)).   Fall Risk:    02/19/2023    8:40 AM 05/30/2022    3:30 PM 02/11/2022    3:24 PM 02/27/2021    3:55 PM 01/03/2021    9:03 AM  Fall Risk   Falls in the past year? 0 0 0 0 0  Number falls in past yr: 0 0 0 0 0  Injury with Fall? 0 0 0 0 0  Risk for fall due to : No Fall Risks No Fall Risks No Fall Risks No Fall Risks No Fall Risks  Follow up Falls evaluation completed Falls evaluation completed Falls evaluation completed Falls evaluation completed Falls evaluation completed     Functional Status Survey:     Assessment & Plan  Problem List Items Addressed This Visit       Other   Elevated LDL cholesterol level   Relevant Orders   Lipid Profile    Advanced care planning/counseling discussion    A voluntary discussion about advance care planning including the explanation and discussion of advance directives was extensively discussed  with the patient for 5 minutes with patient and myself present.  Explanation about the health care proxy and Living will was reviewed and packet with forms with explanation of how to fill them out was given.  During this discussion, the patient was able to identify a health care proxy as  no one and plans to fill out the paperwork required.  Patient was offered a separate Advance Care Planning visit  for further assistance with forms.         Other Visit Diagnoses     Annual physical exam    -  Primary  -USPSTF grade A and B recommendations reviewed with patient; age-appropriate recommendations, preventive care, screening tests, etc discussed and encouraged; healthy living encouraged; see AVS for patient education given to patient -Discussed importance of 150 minutes of physical activity weekly, eat two servings of fish weekly, eat one serving of tree nuts ( cashews, pistachios, pecans, almonds.Marland Kitchen) every other day, eat 6 servings of fruit/vegetables daily and drink plenty of water and avoid sweet beverages.   -Reviewed Health Maintenance: Yes.      Relevant Orders   Comp Met (CMET)   CBC w/Diff   Lipid Profile   HgB A1c   TSH   T4, free   HIV antibody (with reflex)   RPR w/reflex to TrepSure   Screening for STD (sexually transmitted disease)       Relevant Orders   HIV antibody (with reflex)   RPR w/reflex to TrepSure   Need for influenza vaccination       Relevant Orders   Flu vaccine trivalent PF, 6mos and older(Flulaval,Afluria,Fluarix,Fluzone) (Completed)        Return in about 2 weeks (around 03/05/2023) for pap and mood discussion, anxiety.   I, Gerritt Galentine E Slayter Moorhouse, PA-C, have reviewed all documentation for this visit. The documentation on 02/19/23 for the exam, diagnosis, procedures, and  orders are all accurate and complete.   Jacquelin Hawking, MHS, PA-C [facility]

## 2023-02-19 NOTE — Assessment & Plan Note (Signed)
A voluntary discussion about advance care planning including the explanation and discussion of advance directives was extensively discussed  with the patient for 5 minutes with patient and myself present.  Explanation about the health care proxy and Living will was reviewed and packet with forms with explanation of how to fill them out was given.  During this discussion, the patient was able to identify a health care proxy as no one and plans to fill out the paperwork required.  Patient was offered a separate Advance Care Planning visit for further assistance with forms.

## 2023-02-20 ENCOUNTER — Emergency Department
Admission: EM | Admit: 2023-02-20 | Discharge: 2023-02-20 | Disposition: A | Discharge status: Home / Self Care | Payer: BC Managed Care – PPO | Attending: Emergency Medicine | Admitting: Emergency Medicine

## 2023-02-20 ENCOUNTER — Emergency Department: Payer: BC Managed Care – PPO

## 2023-02-20 ENCOUNTER — Other Ambulatory Visit: Payer: Self-pay

## 2023-02-20 ENCOUNTER — Telehealth: Payer: Self-pay | Admitting: *Deleted

## 2023-02-20 ENCOUNTER — Encounter: Payer: Self-pay | Admitting: Intensive Care

## 2023-02-20 DIAGNOSIS — D649 Anemia, unspecified: Secondary | ICD-10-CM

## 2023-02-20 DIAGNOSIS — R5383 Other fatigue: Secondary | ICD-10-CM | POA: Diagnosis present

## 2023-02-20 DIAGNOSIS — D259 Leiomyoma of uterus, unspecified: Secondary | ICD-10-CM

## 2023-02-20 HISTORY — DX: Anemia, unspecified: D64.9

## 2023-02-20 LAB — COMPREHENSIVE METABOLIC PANEL
ALT: 11 [IU]/L (ref 0–32)
ALT: 14 U/L (ref 0–44)
AST: 22 [IU]/L (ref 0–40)
AST: 20 U/L (ref 15–41)
Albumin: 4 g/dL (ref 3.9–4.9)
Albumin: 3.6 g/dL (ref 3.5–5.0)
Alkaline Phosphatase: 56 [IU]/L (ref 44–121)
Alkaline Phosphatase: 48 U/L (ref 38–126)
Anion gap: 9 (ref 5–15)
BUN/Creatinine Ratio: 14 (ref 9–23)
BUN: 9 mg/dL (ref 6–20)
BUN: 12 mg/dL (ref 6–20)
Bilirubin Total: 0.5 mg/dL (ref 0.0–1.2)
CO2: 20 mmol/L (ref 20–29)
CO2: 22 mmol/L (ref 22–32)
Calcium: 8.7 mg/dL (ref 8.7–10.2)
Calcium: 8.6 mg/dL — ABNORMAL LOW (ref 8.9–10.3)
Chloride: 105 mmol/L (ref 96–106)
Chloride: 107 mmol/L (ref 98–111)
Creatinine, Ser: 0.66 mg/dL (ref 0.57–1.00)
Creatinine, Ser: 0.67 mg/dL (ref 0.44–1.00)
GFR, Estimated: >60 mL/min (ref >60)
Globulin, Total: 2.7 g/dL (ref 1.5–4.5)
Glucose, Bld: 98 mg/dL (ref 70–99)
Glucose: 84 mg/dL (ref 70–99)
Potassium: 4.2 mmol/L (ref 3.5–5.2)
Potassium: 3.4 mmol/L — ABNORMAL LOW (ref 3.5–5.1)
Sodium: 139 mmol/L (ref 134–144)
Sodium: 138 mmol/L (ref 135–145)
Total Bilirubin: 0.8 mg/dL (ref 0.3–1.2)
Total Protein: 6.7 g/dL (ref 6.0–8.5)
Total Protein: 7.2 g/dL (ref 6.5–8.1)
eGFR: 117 mL/min/{1.73_m2} (ref >59)

## 2023-02-20 LAB — CBC WITH DIFFERENTIAL/PLATELET
Abs Immature Granulocytes: 0.01 10*3/uL (ref 0.00–0.07)
Basophils Absolute: 0 10*3/uL (ref 0.0–0.2)
Basophils Absolute: 0.1 10*3/uL (ref 0.0–0.1)
Basophils Relative: 2 %
Basos: 1 %
EOS (ABSOLUTE): 0 10*3/uL (ref 0.0–0.4)
Eos: 1 %
Eosinophils Absolute: 0 10*3/uL (ref 0.0–0.5)
Eosinophils Relative: 2 %
HCT: 20.1 % — ABNORMAL LOW (ref 36.0–46.0)
Hematocrit: 19.7 % — ABNORMAL LOW (ref 34.0–46.6)
Hemoglobin: 4.9 g/dL — CL (ref 11.1–15.9)
Hemoglobin: 5.2 g/dL — ABNORMAL LOW (ref 12.0–15.0)
Immature Grans (Abs): 0 10*3/uL (ref 0.0–0.1)
Immature Granulocytes: 0 %
Immature Granulocytes: 0 %
Lymphocytes Absolute: 1.3 10*3/uL (ref 0.7–3.1)
Lymphocytes Relative: 40 %
Lymphs Abs: 1.1 10*3/uL (ref 0.7–4.0)
Lymphs: 35 %
MCH: 17.1 pg — ABNORMAL LOW (ref 26.6–33.0)
MCH: 17.2 pg — ABNORMAL LOW (ref 26.0–34.0)
MCHC: 24.9 g/dL — CL (ref 31.5–35.7)
MCHC: 25.9 g/dL — ABNORMAL LOW (ref 30.0–36.0)
MCV: 69 fL — ABNORMAL LOW (ref 79–97)
MCV: 66.6 fL — ABNORMAL LOW (ref 80.0–100.0)
Monocytes Absolute: 0.3 10*3/uL (ref 0.1–0.9)
Monocytes Absolute: 0.2 10*3/uL (ref 0.1–1.0)
Monocytes Relative: 8 %
Monocytes: 7 %
Neutro Abs: 1.3 10*3/uL — ABNORMAL LOW (ref 1.7–7.7)
Neutrophils Absolute: 2 10*3/uL (ref 1.4–7.0)
Neutrophils Relative %: 48 %
Neutrophils: 56 %
Platelets: 348 10*3/uL (ref 150–450)
Platelets: 313 10*3/uL (ref 150–400)
RBC: 2.86 x10E6/uL — ABNORMAL LOW (ref 3.77–5.28)
RBC: 3.02 MIL/uL — ABNORMAL LOW (ref 3.87–5.11)
RDW: 27 % — ABNORMAL HIGH (ref 11.7–15.4)
RDW: 27.9 % — ABNORMAL HIGH (ref 11.5–15.5)
Smear Review: NORMAL
WBC: 3.7 10*3/uL (ref 3.4–10.8)
WBC: 2.7 10*3/uL — ABNORMAL LOW (ref 4.0–10.5)
nRBC: 0 % (ref 0.0–0.2)

## 2023-02-20 LAB — LIPID PANEL
Chol/HDL Ratio: 3.6 {ratio} (ref 0.0–4.4)
Cholesterol, Total: 156 mg/dL (ref 100–199)
HDL: 43 mg/dL (ref >39)
LDL Chol Calc (NIH): 102 mg/dL — ABNORMAL HIGH (ref 0–99)
Triglycerides: 54 mg/dL (ref 0–149)
VLDL Cholesterol Cal: 11 mg/dL (ref 5–40)

## 2023-02-20 LAB — TSH: TSH: 1.11 u[IU]/mL (ref 0.450–4.500)

## 2023-02-20 LAB — ABO/RH: ABO/RH(D): A POS

## 2023-02-20 LAB — HIV ANTIBODY (ROUTINE TESTING W REFLEX): HIV Screen 4th Generation wRfx: NONREACTIVE

## 2023-02-20 LAB — T4, FREE: Free T4: 1.34 ng/dL (ref 0.82–1.77)

## 2023-02-20 LAB — HCG, QUANTITATIVE, PREGNANCY: hCG, Beta Chain, Quant, S: <1 m[IU]/mL (ref <5)

## 2023-02-20 LAB — HEMOGLOBIN A1C
Est. average glucose Bld gHb Est-mCnc: 100 mg/dL
Hgb A1c MFr Bld: 5.1 % (ref 4.8–5.6)

## 2023-02-20 LAB — TREPONEMAL ANTIBODIES, TPPA: Treponemal Antibodies, TPPA: NONREACTIVE

## 2023-02-20 LAB — RPR W/REFLEX TO TREPSURE: RPR: NONREACTIVE

## 2023-02-20 MED ORDER — MEDROXYPROGESTERONE ACETATE 5 MG PO TABS: 20.0000 mg | ORAL_TABLET | Freq: Three times a day (TID) | ORAL | 0 refills | Status: DC

## 2023-02-20 MED ORDER — FERROUS SULFATE 325 (65 FE) MG PO TBEC: 325.0000 mg | DELAYED_RELEASE_TABLET | Freq: Every day | ORAL | 3 refills | Status: DC

## 2023-02-20 MED ORDER — MEDROXYPROGESTERONE ACETATE 10 MG PO TABS
20.0000 mg | ORAL_TABLET | Freq: Once | ORAL | Status: AC
  Administered 2023-02-20: 20 mg via ORAL
  Filled 2023-02-20: qty 2

## 2023-02-20 MED ORDER — SODIUM CHLORIDE 0.9 % IV SOLN: 10.0000 mL/h | Freq: Once | INTRAVENOUS | Status: DC

## 2023-02-20 NOTE — Progress Notes (Signed)
Your cholesterol looks pretty good Your electrolytes, liver and kidney function were overall normal at this time Your thyroid testing was normal  Your HIV testing was negative We are still waiting on your A1c results and will keep you updated once that is back

## 2023-02-20 NOTE — Telephone Encounter (Signed)
[facility]-reporting critical lab  Hgb 4.9- lab report in chart Called office to notify also

## 2023-02-20 NOTE — ED Provider Notes (Signed)
[facility] Provider Note    Event Date/Time   First MD Initiated Contact with Patient 02/20/23 1110     (approximate)   History   Abnormal Lab   HPI Karen Walton is a 37 y.o. female presenting today for anemia.  Patient states she was at her routine annual exam yesterday where they took blood work and called her today stating her hemoglobin was 4.9.  Patient does have a history of anemia and most recently at 8.0  1-year ago.  She reports normal menstrual cycles for her with most recent one ending 3 days ago and was 5 days long.  She reports changing a pad every 1 hour which is typical for her with each menstrual cycle.  No additional vaginal bleeding.  Otherwise denies hematemesis, melena, hematochezia, hematuria.  She has felt fatigued recently but otherwise denies shortness of breath, palpitations, chest pain.     Physical Exam   Triage Vital Signs: ED Triage Vitals [02/20/23 1052]  Encounter Vitals Group     BP (!) 139/95     Systolic BP Percentile      Diastolic BP Percentile      Pulse Rate (!) 119     Resp 18     Temp 98.2 F (36.8 C)     Temp Source Oral     SpO2 100 %     Weight 160 lb (72.6 kg)     Height 5' 1.5" (1.562 m)     Head Circumference      Peak Flow      Pain Score 0     Pain Loc      Pain Education      Exclude from Growth Chart     Most recent vital signs: Vitals:   02/20/23 1415 02/20/23 1430  BP: 120/74 118/73  Pulse: 93 84  Resp: (!) 21 17  Temp:    SpO2: 100% 100%   Physical Exam: I have reviewed the vital signs and nursing notes. General: Awake, alert, no acute distress.  Nontoxic appearing. Head:  Atraumatic, normocephalic.   ENT:  EOM intact, PERRL. Oral mucosa is pink and moist with no lesions. Neck: Neck is supple with full range of motion, No meningeal signs. Cardiovascular:  RRR, No murmurs. Peripheral pulses palpable and equal bilaterally. Respiratory:  Symmetrical chest wall expansion.  No  rhonchi, rales, or wheezes.  Good air movement throughout.  No use of accessory muscles.   Musculoskeletal:  No cyanosis or edema. Moving extremities with full ROM Abdomen:  Soft, nontender, nondistended. Neuro:  GCS 15, moving all four extremities, interacting appropriately. Speech clear. Psych:  Calm, appropriate.   Skin:  Warm, dry, no rash.    ED Results / Procedures / Treatments   Labs (all labs ordered are listed, but only abnormal results are displayed) Labs Reviewed  CBC WITH DIFFERENTIAL/PLATELET - Abnormal; Notable for the following components:      Result Value   WBC 2.7 (*)    RBC 3.02 (*)    Hemoglobin 5.2 (*)    HCT 20.1 (*)    MCV 66.6 (*)    MCH 17.2 (*)    MCHC 25.9 (*)    RDW 27.9 (*)    Neutro Abs 1.3 (*)    All other components within normal limits  COMPREHENSIVE METABOLIC PANEL - Abnormal; Notable for the following components:   Potassium 3.4 (*)    Calcium 8.6 (*)    All other components within normal  limits  HCG, QUANTITATIVE, PREGNANCY  TYPE AND SCREEN  PREPARE RBC (CROSSMATCH)  ABO/RH     EKG    RADIOLOGY Independently interpreted transvaginal ultrasound showing evidence of uterine fibroids   PROCEDURES:  Critical Care performed: Yes, see critical care procedure note(s)  .Critical Care  Performed by: Janith Lima, MD Authorized by: Janith Lima, MD   Critical care provider statement:    Critical care time (minutes):  30   Critical care was necessary to treat or prevent imminent or life-threatening deterioration of the following conditions:  Circulatory failure (Profound anemia)   Critical care was time spent personally by me on the following activities:  Development of treatment plan with patient or surrogate, discussions with consultants, evaluation of patient's response to treatment, examination of patient, ordering and review of laboratory studies, ordering and review of radiographic studies, ordering and performing treatments and  interventions, pulse oximetry, re-evaluation of patient's condition and review of old charts    MEDICATIONS ORDERED IN ED: Medications  0.9 %  sodium chloride infusion (has no administration in time range)  medroxyPROGESTERone (PROVERA) tablet 20 mg (20 mg Oral Given 02/20/23 1503)     IMPRESSION / MDM / ASSESSMENT AND PLAN / ED COURSE  I reviewed the triage vital signs and the nursing notes.                              Differential diagnosis includes, but is not limited to, chronic heavy menstrual cycles, acute vaginal bleeding, GI bleed.  Patient's presentation is most consistent with acute presentation with potential threat to life or bodily function.  Patient is a 37 year old female presenting today for anemia found on outpatient labs.  Notes some recent fatigue but otherwise relatively asymptomatic.  Found to have a hemoglobin of 4.8 which was confirmed today at 5.2.  Patient has no evidence of GI bleeding symptoms with no history of melena, hematochezia, or hematemesis.  She does note heavy menstrual cycles with changing a pad every 1 hour during a typical 5-day period.  She just finished a cycle 3 days ago.  I suspect her anemia is likely chronically been diminishing from heavier vaginal periods.  Transvaginal ultrasound shows evidence of uterine fibroids.  Talked with gynecology who recommended Provera 20 mg 3 times daily for a week.  Agrees with blood transfusion.  As long as patient is not asymptomatic and feeling well with no ongoing vaginal bleeding, patient can be discharged with follow-up in 1 week with gynecology.  Discussed this plan with patient who is comfortable with this.  Patient was signed out pending completion of blood transfusion and reassessment.  The patient is on the cardiac monitor to evaluate for evidence of arrhythmia and/or significant heart rate changes. Clinical Course as of 02/20/23 1515  Fri Feb 20, 2023  1146 Hemoglobin(!): 5.2 [DW]  1146 MCV(!): 66.6  [DW]  1244 Discussed and consented for blood transfusion.  Will proceed with 2 units PRBC.  Will discuss case with gynecology once transvaginal ultrasound is available [DW]  1349 US PELVIC COMPLETE WITH TRANSVAGINAL Multiple uterine fibroids present [DW]  1433 Spoke with Dr. Jean Rosenthal, Gynecology - follow up with gyn outpatient in the next week. Can see anyone with gynecology. Give provera 20mg  TID for a week.  If no active bleeding at this time, does not need to stay after completion of blood transfusion. [DW]    Clinical Course User Index [DW] Janith Lima, MD  FINAL CLINICAL IMPRESSION(S) / ED DIAGNOSES   Final diagnoses:  Uterine leiomyoma, unspecified location  Anemia, unspecified type     Rx / DC Orders   ED Discharge Orders          Ordered    medroxyPROGESTERone (PROVERA) 5 MG tablet  3 times daily        02/20/23 1514             Note:  This document was prepared using Dragon voice recognition software and may include unintentional dictation errors.   Janith Lima, MD 02/20/23 [phone]

## 2023-02-20 NOTE — Progress Notes (Signed)
Syphilis testing was negative

## 2023-02-20 NOTE — ED Triage Notes (Signed)
Patient sent by PCP for 4.9 hemoglobin. Labs drew yesterday. Patient reports she was seen yesterday for her physical. Reports feeling fatigue

## 2023-02-20 NOTE — ED Notes (Signed)
Pt. Placed on cardiac monitoring 

## 2023-02-20 NOTE — Progress Notes (Signed)
Please call patient and instruct them to go to the ED for critical anemia - she will likely need transfusion

## 2023-02-20 NOTE — ED Notes (Signed)
ED Provider at bedside. 

## 2023-02-20 NOTE — Discharge Instructions (Addendum)
You were seen in the emergency department today for your anemia or low blood counts.  I suspect this is due to chronic heavier periods.  Ultrasound shows evidence of fibroids but no other severe abnormalities.  We are going to have you follow-up with gynecology within the week for reassessment and ongoing management.  If you have new vaginal bleeding outside of your normal periods, develop lightheadedness or passout, or severe shortness of breath, please return to the emergency department immediately.  I have sent a hormone medication to your pharmacy which you will take 3 times a day as prescribed for the next week.  Please also follow-up with your primary care provider to recheck your hemoglobin within the next several days.

## 2023-02-22 LAB — TYPE AND SCREEN
ABO/RH(D): A POS
Antibody Screen: NEGATIVE
Unit division: 0
Unit division: 0

## 2023-02-22 LAB — BPAM RBC
Blood Product Expiration Date: 202410262359
Blood Product Expiration Date: 202410262359
ISSUE DATE / TIME: 202409271347
Unit Type and Rh: 6200
Unit Type and Rh: 6200

## 2023-02-22 LAB — PREPARE RBC (CROSSMATCH)

## 2023-02-24 DIAGNOSIS — D500 Iron deficiency anemia secondary to blood loss (chronic): Secondary | ICD-10-CM | POA: Diagnosis not present

## 2023-02-24 DIAGNOSIS — D259 Leiomyoma of uterus, unspecified: Secondary | ICD-10-CM | POA: Diagnosis not present

## 2023-02-24 DIAGNOSIS — N920 Excessive and frequent menstruation with regular cycle: Secondary | ICD-10-CM | POA: Diagnosis not present

## 2023-02-27 ENCOUNTER — Other Ambulatory Visit: Payer: Self-pay | Admitting: Obstetrics and Gynecology

## 2023-03-09 ENCOUNTER — Encounter: Payer: Self-pay | Admitting: Physician Assistant

## 2023-03-09 DIAGNOSIS — D649 Anemia, unspecified: Secondary | ICD-10-CM

## 2023-03-12 ENCOUNTER — Ambulatory Visit: Payer: BC Managed Care – PPO | Admitting: Family Medicine

## 2023-04-28 DIAGNOSIS — D500 Iron deficiency anemia secondary to blood loss (chronic): Secondary | ICD-10-CM | POA: Diagnosis not present

## 2023-04-28 DIAGNOSIS — N920 Excessive and frequent menstruation with regular cycle: Secondary | ICD-10-CM | POA: Diagnosis not present

## 2023-04-28 DIAGNOSIS — D259 Leiomyoma of uterus, unspecified: Secondary | ICD-10-CM | POA: Diagnosis not present

## 2023-04-28 NOTE — H&P (Signed)
Preoperative History and Physical  Chief Complaint: Karen Walton is a 37 y.o. [phone] here for surgical management of Menorrhagia with regular cycle, fibroid uterus, anemia resulting from acute on chronic blood loss.   No significant preoperative concerns.  History of Present Illness: 37 y.o. [id number] female who presents in follow up from an ER visit on 02/20/2023 for anemia due to menstrual bleeding.  Her hemoglobin was 5.2 (on 9/26 she had a level of 4.9).    She states that she received 1 unit pRBCs (plan was for 2 units).  She was having no real symptoms.     She states that she had two cycles in August.  Her most recent period was slightly heavy. But, they are always heavy. They usually last about 5 days. They are usually about 4 weeks apart. She notes clots and cramps.   She has had a tubal ligation (2013) and has taken nothing for contraception, otherwise.    She was given provera 20 mg tid.  She didn't take the medication yesterday or today because it made her cramp a lot.     Last pap smaer 01/2022: ASCUS, HPV negative 2019: NILM, HPV negative   FINDINGS:  Uterus   Measurements: 10.2 x 6.6 x 6.7 cm = volume: 235 mL. The uterus is  enlarged with multiple fibroids. There is a 2.8 x 3.0 x 2.5 cm right  anterior upper body and a 1.8 x 1.7 x 2.1 cm anterior midbody  intramural fibroids. A 3.8 x 3.0 x 3.1 cm left body intramural  fibroid. The uterus demonstrates a heterogeneous echogenicity with  increased vascularity.   Endometrium   Thickness: 12 mm. The endometrium demonstrates increased  vascularity.   Right ovary   Measurements: 3.4 x 1.6 x 2.2 cm = volume: 6.4 mL. Normal  appearance/no adnexal mass.   Left ovary   Measurements: 3.2 x 1.8 x 1.9 cm = volume: 5.5 mL. Normal  appearance/no adnexal mass.   Other findings   No abnormal free fluid.   IMPRESSION:  1. Heterogeneous uterus with multiple fibroids and findings of  possible adenomyosis.  2. Increased  endometrial vascularity.  3. Unremarkable ovaries.   Proposed surgery: Robot assisted total laparoscopic hysterectomy, bilateral salpingectomy, cystoscopy   Past Medical History:  Diagnosis Date   Anemia    Past Surgical History:  Procedure Laterality Date   TUBAL LIGATION  2013   CHOLECYSTECTOMY OPEN     OB History  Gravida Para Term Preterm AB Living  2 2 2     2   SAB IAB Ectopic Molar Multiple Live Births            2    # Outcome Date GA Lbr Len/2nd Weight Sex Type Anes PTL Lv  2 Term 10/28/11    M Vag-Spont   LIV  1 Term 12/17/07    Karen Walton   LIV  Patient denies any other pertinent gynecologic issues.   Current Outpatient Medications on File Prior to Visit  Medication Sig Dispense Refill   ferrous sulfate 325 (65 FE) MG EC tablet Take 325 mg by mouth once daily     medroxyPROGESTERone (PROVERA) 5 MG tablet Take by mouth     No current facility-administered medications on file prior to visit.   Allergies  Allergen Reactions   Metronidazole Nausea And Vomiting    Social History:   reports that she has never smoked. She has never used smokeless tobacco. She reports that she does not currently use alcohol.  She reports that she does not use drugs.  Family History  Problem Relation Name Age of Onset   Fibroids Mother     Fibroids Maternal Grandmother      Review of Systems: Noncontributory  PHYSICAL EXAM: Blood pressure 110/64, pulse 93, height 154.9 cm (5\' 1" ), weight 74.6 kg (164 lb 6.4 oz), last menstrual period 04/12/2023. CONSTITUTIONAL: Well-developed, well-nourished female in no acute distress.  HENT:  Normocephalic, atraumatic, External right and left ear normal. Oropharynx is clear and moist EYES: Conjunctivae and EOM are normal. Pupils are equal, round, and reactive to light. No scleral icterus.  NECK: Normal range of motion, supple, no masses SKIN: Skin is warm and dry. No rash noted. Not diaphoretic. No erythema. No pallor. NEUROLGIC: Alert and  oriented to person, place, and time. Normal reflexes, muscle tone coordination. No cranial nerve deficit noted. PSYCHIATRIC: Normal mood and affect. Normal behavior. Normal judgment and thought content. CARDIOVASCULAR: Normal heart rate noted, regular rhythm RESPIRATORY: Effort and breath sounds normal, no problems with respiration noted ABDOMEN: Soft, nontender, nondistended. PELVIC: Deferred MUSCULOSKELETAL: Normal range of motion. No edema and no tenderness. 2+ distal pulses.  Labs: No results found for this or any previous visit (from the past 2 weeks).  Imaging Studies: No results found.  Assessment: 1. Menorrhagia with regular cycle   2. Uterine leiomyoma, unspecified location   3. Anemia due to chronic blood loss      Plan: Patient will undergo surgical management with the above-noted surgery.   The risks of surgery were discussed in detail with the patient including but not limited to: bleeding which may require transfusion or reoperation; infection which may require antibiotics; injury to surrounding organs which may involve bowel, bladder, ureters ; need for additional procedures including laparoscopy or laparotomy; thromboembolic phenomenon, surgical site problems and other postoperative/anesthesia complications. Likelihood of success in alleviating the patient's condition was discussed. Routine postoperative instructions will be reviewed with the patient and her family in detail after surgery.  The patient concurred with the proposed plan, giving informed written consent for the surgery.   Preoperative prophylactic antibiotics, as indicated, and SCDs ordered on call to the OR.    Return in 27 days (on 05/25/2023) for Post-op incision check.   Attestation Statement:   I personally performed the service. (TP)  Karen Kamphuis Teola Bradley, MD  [facility] OB/GYN [facility] 04/28/2023 10:12 AM

## 2023-05-07 ENCOUNTER — Encounter
Admission: RE | Admit: 2023-05-07 | Discharge: 2023-05-07 | Disposition: A | Discharge status: Home / Self Care | Payer: BC Managed Care – PPO | Source: Ambulatory Visit | Attending: Obstetrics and Gynecology | Admitting: Obstetrics and Gynecology

## 2023-05-07 VITALS — Ht 61.5 in | Wt 164.5 lb

## 2023-05-07 DIAGNOSIS — E7800 Pure hypercholesterolemia, unspecified: Secondary | ICD-10-CM

## 2023-05-07 DIAGNOSIS — Z7189 Other specified counseling: Secondary | ICD-10-CM

## 2023-05-07 DIAGNOSIS — F339 Major depressive disorder, recurrent, unspecified: Secondary | ICD-10-CM

## 2023-05-07 DIAGNOSIS — Z01818 Encounter for other preprocedural examination: Secondary | ICD-10-CM

## 2023-05-07 DIAGNOSIS — F419 Anxiety disorder, unspecified: Secondary | ICD-10-CM

## 2023-05-07 HISTORY — DX: Excessive and frequent menstruation with regular cycle: N92.0

## 2023-05-07 HISTORY — DX: Leiomyoma of uterus, unspecified: D25.9

## 2023-05-07 HISTORY — DX: Dyspnea, unspecified: R06.00

## 2023-05-07 NOTE — Patient Instructions (Addendum)
Your procedure is scheduled on:05-15-23 Friday Report to the Registration Desk on the 1st floor of the [facility].Then proceed to the 2nd floor Surgery Desk To find out your arrival time, please call [phone] between 1PM - 3PM on:05-14-23 Thursday If your arrival time is 6:00 am, do not arrive before that time as the Medical Mall entrance doors do not open until 6:00 am.  REMEMBER: Instructions that are not followed completely may result in serious medical risk, up to and including death; or upon the discretion of your surgeon and anesthesiologist your surgery may need to be rescheduled.  Do not eat food OR drink any liquids after midnight the night before surgery.  No gum chewing or hard candies  One week prior to surgery:Stop NOW (05-07-23) Stop Anti-inflammatories (NSAIDS) such as Advil, Aleve, Ibuprofen, Motrin, Naproxen, Naprosyn and Aspirin based products such as Excedrin, Goody's Powder, BC Powder. Stop ANY OVER THE COUNTER supplements until after surgery (Continue your Iron pill)  You may however, continue to take Tylenol if needed for pain up until the day of surgery.  Continue taking all of your other prescription medications up until the day of surgery.  Do NOT take any medication the day of surgery  No Alcohol for 24 hours before or after surgery.  No Smoking including e-cigarettes for 24 hours before surgery.  No chewable tobacco products for at least 6 hours before surgery.  No nicotine patches on the day of surgery.  Do not use any "recreational" drugs for at least a week (preferably 2 weeks) before your surgery.  Please be advised that the combination of cocaine and anesthesia may have negative outcomes, up to and including death. If you test positive for cocaine, your surgery will be cancelled.  On the morning of surgery brush your teeth with toothpaste and water, you may rinse your mouth with mouthwash if you wish. Do not swallow any toothpaste or  mouthwash.  Use CHG Soap as directed on instruction sheet.  Do not wear jewelry, make-up, hairpins, clips or nail polish.  For welded (permanent) jewelry: bracelets, anklets, waist bands, etc.  Please have this removed prior to surgery.  If it is not removed, there is a chance that hospital personnel will need to cut it off on the day of surgery.  Do not wear lotions, powders, or perfumes.   Do not shave body hair from the neck down 48 hours before surgery.  Contact lenses, hearing aids and dentures may not be worn into surgery.  Do not bring valuables to the hospital. [facility] is not responsible for any missing/lost belongings or valuables.   Notify your doctor if there is any change in your medical condition (cold, fever, infection).  Wear comfortable clothing (specific to your surgery type) to the hospital.  After surgery, you can help prevent lung complications by doing breathing exercises.  Take deep breaths and cough every 1-2 hours. Your doctor may order a device called an Incentive Spirometer to help you take deep breaths. When coughing or sneezing, hold a pillow firmly against your incision with both hands. This is called "splinting." Doing this helps protect your incision. It also decreases belly discomfort.  If you are being admitted to the hospital overnight, leave your suitcase in the car. After surgery it may be brought to your room.  In case of increased patient census, it may be necessary for you, the patient, to continue your postoperative care in the Same Day Surgery department.  If you are being  discharged the day of surgery, you will not be allowed to drive home. You will need a responsible individual to drive you home and stay with you for 24 hours after surgery.   If you are taking public transportation, you will need to have a responsible individual with you.  Please call the Pre-admissions Testing Dept. at [phone] if you have any questions about  these instructions.  Surgery Visitation Policy:  Patients having surgery or a procedure may have two visitors.  Children under the age of 80 must have an adult with them who is not the patient.     Preparing for Surgery with CHLORHEXIDINE GLUCONATE (CHG) Soap  Chlorhexidine Gluconate (CHG) Soap  o An antiseptic cleaner that kills germs and bonds with the skin to continue killing germs even after washing  o Used for showering the night before surgery and morning of surgery  Before surgery, you can play an important role by reducing the number of germs on your skin.  CHG (Chlorhexidine gluconate) soap is an antiseptic cleanser which kills germs and bonds with the skin to continue killing germs even after washing.  Please do not use if you have an allergy to CHG or antibacterial soaps. If your skin becomes reddened/irritated stop using the CHG.  1. Shower the NIGHT BEFORE SURGERY and the MORNING OF SURGERY with CHG soap.  2. If you choose to wash your hair, wash your hair first as usual with your normal shampoo.  3. After shampooing, rinse your hair and body thoroughly to remove the shampoo.  4. Use CHG as you would any other liquid soap. You can apply CHG directly to the skin and wash gently with a scrungie or a clean washcloth.  5. Apply the CHG soap to your body only from the neck down. Do not use on open wounds or open sores. Avoid contact with your eyes, ears, mouth, and genitals (private parts). Wash face and genitals (private parts) with your normal soap.  6. Wash thoroughly, paying special attention to the area where your surgery will be performed.  7. Thoroughly rinse your body with warm water.  8. Do not shower/wash with your normal soap after using and rinsing off the CHG soap.  9. Pat yourself dry with a clean towel.  10. Wear clean pajamas to bed the night before surgery.  12. Place clean sheets on your bed the night of your first shower and do not sleep with  pets.  13. Shower again with the CHG soap on the day of surgery prior to arriving at the hospital.  14. Do not apply any deodorants/lotions/powders.  15. Please wear clean clothes to the hospital.  How to Use an Incentive Spirometer An incentive spirometer is a tool that measures how well you are filling your lungs with each breath. Learning to take long, deep breaths using this tool can help you keep your lungs clear and active. This may help to reverse or lessen your chance of developing breathing (pulmonary) problems, especially infection. You may be asked to use a spirometer: After a surgery. If you have a lung problem or a history of smoking. After a long period of time when you have been unable to move or be active. If the spirometer includes an indicator to show the highest number that you have reached, your health care provider or respiratory therapist will help you set a goal. Keep a log of your progress as told by your health care provider. What are the risks? Breathing  too quickly may cause dizziness or cause you to pass out. Take your time so you do not get dizzy or light-headed. If you are in pain, you may need to take pain medicine before doing incentive spirometry. It is harder to take a deep breath if you are having pain. How to use your incentive spirometer  Sit up on the edge of your bed or on a chair. Hold the incentive spirometer so that it is in an upright position. Before you use the spirometer, breathe out normally. Place the mouthpiece in your mouth. Make sure your lips are closed tightly around it. Breathe in slowly and as deeply as you can through your mouth, causing the piston or the ball to rise toward the top of the chamber. Hold your breath for 3-5 seconds, or for as long as possible. If the spirometer includes a coach indicator, use this to guide you in breathing. Slow down your breathing if the indicator goes above the marked areas. Remove the mouthpiece from  your mouth and breathe out normally. The piston or ball will return to the bottom of the chamber. Rest for a few seconds, then repeat the steps 10 or more times. Take your time and take a few normal breaths between deep breaths so that you do not get dizzy or light-headed. Do this every 1-2 hours when you are awake. If the spirometer includes a goal marker to show the highest number you have reached (best effort), use this as a goal to work toward during each repetition. After each set of 10 deep breaths, cough a few times. This will help to make sure that your lungs are clear. If you have an incision on your chest or abdomen from surgery, place a pillow or a rolled-up towel firmly against the incision when you cough. This can help to reduce pain while taking deep breaths and coughing. General tips When you are able to get out of bed: Walk around often. Continue to take deep breaths and cough in order to clear your lungs. Keep using the incentive spirometer until your health care provider says it is okay to stop using it. If you have been in the hospital, you may be told to keep using the spirometer at home. Contact a health care provider if: You are having difficulty using the spirometer. You have trouble using the spirometer as often as instructed. Your pain medicine is not giving enough relief for you to use the spirometer as told. You have a fever. Get help right away if: You develop shortness of breath. You develop a cough with bloody mucus from the lungs. You have fluid or blood coming from an incision site after you cough. Summary An incentive spirometer is a tool that can help you learn to take long, deep breaths to keep your lungs clear and active. You may be asked to use a spirometer after a surgery, if you have a lung problem or a history of smoking, or if you have been inactive for a long period of time. Use your incentive spirometer as instructed every 1-2 hours while you are  awake. If you have an incision on your chest or abdomen, place a pillow or a rolled-up towel firmly against your incision when you cough. This will help to reduce pain. Get help right away if you have shortness of breath, you cough up bloody mucus, or blood comes from your incision when you cough. This information is not intended to replace advice given to you by  your health care provider. Make sure you discuss any questions you have with your health care provider. Document Revised: 08/01/2019 Document Reviewed: 08/01/2019 Elsevier Patient Education  2024 [employer].

## 2023-05-13 ENCOUNTER — Encounter
Admission: RE | Admit: 2023-05-13 | Discharge: 2023-05-13 | Disposition: A | Discharge status: Home / Self Care | Payer: BC Managed Care – PPO | Source: Ambulatory Visit | Attending: Obstetrics and Gynecology | Admitting: Obstetrics and Gynecology

## 2023-05-13 DIAGNOSIS — Z01812 Encounter for preprocedural laboratory examination: Secondary | ICD-10-CM | POA: Insufficient documentation

## 2023-05-13 DIAGNOSIS — R0602 Shortness of breath: Secondary | ICD-10-CM | POA: Insufficient documentation

## 2023-05-13 DIAGNOSIS — N838 Other noninflammatory disorders of ovary, fallopian tube and broad ligament: Secondary | ICD-10-CM | POA: Diagnosis not present

## 2023-05-13 DIAGNOSIS — D509 Iron deficiency anemia, unspecified: Secondary | ICD-10-CM | POA: Insufficient documentation

## 2023-05-13 DIAGNOSIS — Z9851 Tubal ligation status: Secondary | ICD-10-CM | POA: Diagnosis not present

## 2023-05-13 DIAGNOSIS — D259 Leiomyoma of uterus, unspecified: Secondary | ICD-10-CM | POA: Insufficient documentation

## 2023-05-13 DIAGNOSIS — N72 Inflammatory disease of cervix uteri: Secondary | ICD-10-CM | POA: Diagnosis not present

## 2023-05-13 DIAGNOSIS — N920 Excessive and frequent menstruation with regular cycle: Secondary | ICD-10-CM | POA: Insufficient documentation

## 2023-05-13 DIAGNOSIS — D62 Acute posthemorrhagic anemia: Secondary | ICD-10-CM | POA: Diagnosis not present

## 2023-05-13 DIAGNOSIS — R5382 Chronic fatigue, unspecified: Secondary | ICD-10-CM | POA: Insufficient documentation

## 2023-05-13 DIAGNOSIS — D75839 Thrombocytosis, unspecified: Secondary | ICD-10-CM | POA: Diagnosis not present

## 2023-05-13 DIAGNOSIS — N7011 Chronic salpingitis: Secondary | ICD-10-CM | POA: Diagnosis not present

## 2023-05-13 DIAGNOSIS — D251 Intramural leiomyoma of uterus: Secondary | ICD-10-CM | POA: Diagnosis not present

## 2023-05-13 LAB — RETICULOCYTES
Immature Retic Fract: 25.4 % — ABNORMAL HIGH (ref 2.3–15.9)
RBC.: 3.28 MIL/uL — ABNORMAL LOW (ref 3.87–5.11)
Retic Count, Absolute: 77.1 10*3/uL (ref 19.0–186.0)
Retic Ct Pct: 2.4 % (ref 0.4–3.1)

## 2023-05-13 LAB — CBC
HCT: 24.6 % — ABNORMAL LOW (ref 36.0–46.0)
Hemoglobin: 6.5 g/dL — ABNORMAL LOW (ref 12.0–15.0)
MCH: 19.6 pg — ABNORMAL LOW (ref 26.0–34.0)
MCHC: 26.4 g/dL — ABNORMAL LOW (ref 30.0–36.0)
MCV: 74.1 fL — ABNORMAL LOW (ref 80.0–100.0)
Platelets: 1061 10*3/uL (ref 150–400)
RBC: 3.32 MIL/uL — ABNORMAL LOW (ref 3.87–5.11)
RDW: 26.7 % — ABNORMAL HIGH (ref 11.5–15.5)
WBC: 5.1 10*3/uL (ref 4.0–10.5)
nRBC: 0.4 % — ABNORMAL HIGH (ref 0.0–0.2)

## 2023-05-13 NOTE — Progress Notes (Addendum)
[facility] Perioperative Services: Pre-Admission/Anesthesia Testing  Abnormal Lab Notification and Treatment Plan of Care   Date: 05/13/23  Name: Karen Walton MRN:   [id number]  Re: Abnormal labs noted during PAT appointment   Notified: Provider Name Provider Role Notification Mode  [name], MD OB/GYN (Surgeon) Routed and/or faxed via [facility]   Clinical Information and Notes:  ABNORMAL LAB VALUE(S): Lab Results  Component Value Date   WBC 5.1 05/13/2023   HGB 6.5 (L) 05/13/2023   HCT 24.6 (L) 05/13/2023   MCV 74.1 (L) 05/13/2023   PLT 1,061 (HH) 05/13/2023   Impression and Plan:  Verdis Prime is scheduled for a XI ROBOTIC ASSISTED LAPAROSCOPIC HYSTERECTOMY AND SALPINGECTOMY (Bilateral); CYSTOSCOPY on 05/15/2023 with Dr. [name], MD. Patient has a history of menorrhagia with uterine leiomyoma.   Preoperative labs revealed a significant anemia with a Hgb of 6.5 g/dL and Hct of 36.6%. 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 Hgb 4.9 g/dL on 44/07/4740. Patient was contacted and advised to go to the ED for further evaluation and blood transfusion. While in the ED, Hgb was rechecked and found to be 5.2 g/dL, for which patient received 1 unit of PRBCs. Patient was started on oral FeSO4 325 mg daily on 02/19/2023.  Platelet count significantly elevated today at 1,061 K/uL. Thrombocytosis likely reactive/secondary in response to significant iron deficiency anemia. Unfortunately, patient did not have the tubes collected today to add on additional studies to further evaluate her anemia (B12, Fe/TIBC, ferritin). I was able to add a reticulocyte level, which resulted WNL at 2.4%. IRF appropriately elevated at 25.4% in the setting of acute on chronic blood loss anemia. Would recommend obtaining the other lab studies with next lab draw. Patient did  recently have a TSH drawn on 02/19/2023 that was WNL at 1.110 uIU/mL.   Looking back over the course of the last several years (dating back to at least 2013), patient has had varying degrees of anemia. Presumably this was been related to her menstrual cycles, however I am uncertain if this can be attributed solely to her menstrual cycles. It is unclear as to whether or not patient has always experienced heavy menstrual bleeding to the extent of resulting in chronic anemia. Review of labs does not provide evidence of work up for secondary causes. Patient reported SOB and chronic fatigue in clinic today, however it is unclear how long this has been going on and if she has perceived an exacerbation of her symptoms. IDA has never been officially diagnosed, hence the additional studies to check basic iron stores and vitamin levels.   Once hysterectomy has been performed her anemia should improve. Given the degree of her presumed iron deficiency, based on her RBC indices, may need to consider up titration of her daily FeSO4 dose to twice daily at least short term. Could also consider intravenous iron infusions as well. Recommend following up on lab studies to ensure that levels are appropriately corrected postoperatively. Anemia labs now will stand to expedite postoperative care should parenteral infusions be considered as a potential management option. If postoperative labs fail to demonstrate improvement, further investigation into other potential causes of the patient's anemia would be warranted.   Type and screen was updated today. Ideally, should receive additional optimization of her hemoglobin preoperatively in anticipation of her Hgb continuing to decline and to mitigate any significant intraoperative blood loss. I reached  out to patients primary attending surgeon to discuss. MD to contact patient to discuss management options.   ADDENDUM 05/13/2023 at 1315:  Received return communication from Dr. Jean Rosenthal  regarding patients labs. Obviously, patient cannot undergo surgery as planned on Friday with her current hemoglobin. MD has reached out to the patient to discuss options, those of which include PRBC transfusion, iron infusion, and as a last resort, postponing surgery. Delaying surgery will only allow patient's condition to worsen in terms of her anemia. We can make efforts to temporarily optimize her anemia, however without definitive surgical intervention, patient is at risk for further bleeding and worsening anemia. All of this was explained to the patient by Dr. Jean Rosenthal.  Patient amenable to blood transfusion as recommended by both the OB/GYN and anesthesia service lines. Patient unable to come in today for blood, however she expresses that she would be able to come in tomorrow (05/14/2023). MD requesting 2-3 units of PRBCs to be transfused. Orders entered for remaining anemia workup in efforts to direct postoperative care with regards to optimization of her anemia and repletion of her iron stores. Will order 2 units initially, with plans to recheck a CBC approximately 30 minutes following the second unit. Checking a full CBC, as opposed to H&H, so we can also have an idea of what her platelets levels are. Again, thrombocytosis is felt to be reactive and should improve as anemia is corrected. If after 2 units of PRBCs the patient's Hgb remains low/marginal (< 9 g/dL), will plan on transfusing the 3rd unit. As previously mentioned, patient continues to bleed. She is actively menstruating as confirmed by Dr. Jean Rosenthal. Anticipate that Hgb level will continue to drop between now and the time of surgery. Also, we must allow/prepare for potential blood loss associated with the procedure/surgery, hence the rationale for getting patient closer to the 9 g/dL level.    I will enter the orders for the blood and labs and will communicate plans with SDS charge nurse. Patient has been made aware of transfusional needs and  that she has subsequently been scheduled to receive blood in our SDS area tomorrow (05/14/2023). She has been advised that she will need to arrive at 0745-0800 AM tomorrow (05/14/2023). Blood has been ordered today. I have confirmed that blood bank received both the crossmatch (prepare) and transfuse orders, With these measures in place, blood should be ready to be issued for transfusion immediately upon patient arrival.   Recap of orders: Place PIV with appropriate flushes to be given PRN Check B12, iron studies, ferritin level (attempt to obtain with PIV placement as patient is anxious) Transfuse 2 units PRBCs over 3 hours each Recheck CBC 30 minutes after completion of 2nd unit PRBCs If Hgb < 9 g/dL on recheck, transfuse 3rd unit of PRBCs  Following completion of PRBC transfusion, as based on the above guidelines, patient will be discharged with plans for scheduled hysterectomy on 05/15/2023. Will recheck repeat CBC, again to look at post transfusional Hgb and platelet levels, on the day of patient's procedure. This will allow [place] to establish a new preoperative baseline, which will serve to guide care decisions throughout the remaining portions of her perioperative course. Will follow up on ordered labs and communicate any changes in plan of care to the patient/care team.   In efforts to promote continuity of care and ensure appropriate follow up, I will plan on sending notes to patient's PCP postoperatively with a summary of perioperative anemia management. SDS staff to notify myself  and/or Dr. Jean Rosenthal with any questions or concerns related to the defined plan of care for this patient.  Quentin Mulling, MSN, APRN, FNP-C, CEN [facility]  Perioperative Services Nurse Practitioner Phone: [phone] 05/13/23 11:16 AM  NOTE: This note has been prepared using Dragon dictation software. Despite my best ability to proofread, there is always the potential that unintentional  transcriptional errors may still occur from this process.

## 2023-05-14 ENCOUNTER — Ambulatory Visit
Admission: RE | Admit: 2023-05-14 | Discharge: 2023-05-14 | Disposition: A | Discharge status: Home / Self Care | Payer: BC Managed Care – PPO | Source: Ambulatory Visit | Attending: Obstetrics and Gynecology | Admitting: Obstetrics and Gynecology

## 2023-05-14 VITALS — BP 123/80 | HR 77 | Temp 98.2°F | Resp 16

## 2023-05-14 DIAGNOSIS — Z01812 Encounter for preprocedural laboratory examination: Secondary | ICD-10-CM | POA: Insufficient documentation

## 2023-05-14 DIAGNOSIS — D259 Leiomyoma of uterus, unspecified: Secondary | ICD-10-CM | POA: Insufficient documentation

## 2023-05-14 DIAGNOSIS — Z5189 Encounter for other specified aftercare: Secondary | ICD-10-CM

## 2023-05-14 DIAGNOSIS — D509 Iron deficiency anemia, unspecified: Secondary | ICD-10-CM | POA: Diagnosis not present

## 2023-05-14 DIAGNOSIS — N920 Excessive and frequent menstruation with regular cycle: Secondary | ICD-10-CM | POA: Insufficient documentation

## 2023-05-14 LAB — CBC
HCT: 33.5 % — ABNORMAL LOW (ref 36.0–46.0)
Hemoglobin: 9.8 g/dL — ABNORMAL LOW (ref 12.0–15.0)
MCH: 22.4 pg — ABNORMAL LOW (ref 26.0–34.0)
MCHC: 29.3 g/dL — ABNORMAL LOW (ref 30.0–36.0)
MCV: 76.5 fL — ABNORMAL LOW (ref 80.0–100.0)
Platelets: 851 K/uL — ABNORMAL HIGH (ref 150–400)
RBC: 4.38 MIL/uL (ref 3.87–5.11)
RDW: 23.7 % — ABNORMAL HIGH (ref 11.5–15.5)
WBC: 6.6 K/uL (ref 4.0–10.5)
nRBC: 0.3 % — ABNORMAL HIGH (ref 0.0–0.2)

## 2023-05-14 LAB — IRON AND TIBC
Iron: 13 ug/dL — ABNORMAL LOW (ref 28–170)
Saturation Ratios: 2 % — ABNORMAL LOW (ref 10.4–31.8)
TIBC: 556 ug/dL — ABNORMAL HIGH (ref 250–450)
UIBC: 543 ug/dL

## 2023-05-14 LAB — FERRITIN: Ferritin: 1 ng/mL — ABNORMAL LOW (ref 11–307)

## 2023-05-14 LAB — VITAMIN B12: Vitamin B-12: 345 pg/mL (ref 180–914)

## 2023-05-14 MED ORDER — SODIUM CHLORIDE 0.9% IV SOLUTION: Freq: Once | INTRAVENOUS | Status: DC

## 2023-05-14 MED ORDER — SOD CITRATE-CITRIC ACID 500-334 MG/5ML PO SOLN
30.0000 mL | ORAL | Status: DC
  Filled 2023-05-14: qty 30

## 2023-05-14 MED ORDER — POVIDONE-IODINE 10 % EX SWAB
2.0000 | Freq: Once | CUTANEOUS | Status: AC
  Administered 2023-05-15: 2 via TOPICAL

## 2023-05-14 MED ORDER — SODIUM CHLORIDE 0.9 % IV SOLN: INTRAVENOUS | Status: DC

## 2023-05-14 MED ORDER — LACTATED RINGERS IV SOLN
INTRAVENOUS | Status: DC
Start: 2023-05-14 — End: 2023-05-15

## 2023-05-14 MED ORDER — CHLORHEXIDINE GLUCONATE 0.12 % MT SOLN
15.0000 mL | Freq: Once | OROMUCOSAL | Status: AC
  Administered 2023-05-15: 15 mL via OROMUCOSAL

## 2023-05-14 MED ORDER — ORAL CARE MOUTH RINSE: 15.0000 mL | Freq: Once | OROMUCOSAL | Status: AC

## 2023-05-14 MED ORDER — CEFAZOLIN SODIUM-DEXTROSE 2-4 GM/100ML-% IV SOLN
2.0000 g | INTRAVENOUS | Status: AC
  Administered 2023-05-15: 2 g via INTRAVENOUS

## 2023-05-14 NOTE — Discharge Instructions (Signed)
Increase iron to two times a day Take with source of Vitamin C (OJ) to help with absorption. Can also get another form of iron (Vitron-C) that has the vitamin C built in. Some say it is easier on the stomach too.  If she stays on the iron sulfate... may want to get her to get a stool softener to take to ward off any constipation... especially after surgery.

## 2023-05-14 NOTE — Progress Notes (Addendum)
Perioperative Services Pre-Admission/Anesthesia Testing    Date: 05/14/23  Name: Karen Walton MRN:   [id number]  Re: Symptomatic anemia and plans for preoperative optimization  Planned Surgical Procedure(s):    Case: [id number] Date/Time: 05/15/23 0715   Procedures:      XI ROBOTIC ASSISTED LAPAROSCOPIC HYSTERECTOMY AND SALPINGECTOMY (Bilateral)     CYSTOSCOPY   Anesthesia type: Choice   Pre-op diagnosis: menorrhagia with regular cycles, fibroids, anemia   Location: [facility] OR ROOM 07 / [facility]   Surgeons: Conard Novak, MD      Clinical Notes:  Patient is scheduled for the above procedure on 05/15/2023 with Dr. [name], MD for further treatment of menorrhagia, uterine leiomyoma, and resulting acute on chronic blood loss anemia. She is a G2P2002, giving birth in 11/2007 and 10/2011. No history of VTE or known bleeding disorders. Age of menarche unknown to me at this time. Patient is s/p BTL in 2013 with no additional contraceptive measures since that time.   Procedure, patient presented to the PAT clinic on 05/13/2023 for preoperative labs.  Review of her preoperative labs revealed that patient had a significantly low Hgb level of 6.5 g/dL with an HCT of 43.3%.  Reactive thrombocytosis noted with a platelet count of 1061 K/uL.  CBC with microcytic hyperchromic indices (MCV 74.1 and MCH 19.6) consistent with iron deficiency.    Review of available records indicates that, dating back to at least 2013, patient has been anemic to a varying degree. Anemia presumably due to menstrual blood loss, however it is unclear if there are other contributing factors as well (i.e. absorption issues, nutritional vitamin/mineral deficiencies, other GI/GU blood loss, etc). Historically, renal function appears to have been normal, this not felt to be implicated in patient's anemia history.    Patient had labs on 02/19/2023 revealing a hemoglobin of 4.9 g/dL.  She was  advised by her PCP to go to the ED for further evaluation and blood transfusion.  In the ED, hemoglobin rechecked and found to be 5.2 g/dL.  Patient was transfused with 1 unit of PRBCs. Patient was started on Provera prior to discharge from the ED. Due to associated cramping, she has not been using this medication as prescribed.  She was also started on FeSO4 325 mg daily on 02/19/2023.   Again, it does not appear as if patient has had a formal anemia workup in the past.  Medical records were reviewed for any pertinent labs. Additional labs were ordered as part of her preoperative evaluation and optimization.  DATE LAB INTERPRETATION VALUE  02/19/2023 TSH Normal 1.110 uIU/mL  05/13/2023 Reticulocytes Normal  2.4 %  05/13/2023 Immature reticulocyte fraction High 25.4 %  05/14/2023 Vitamin B12 Normal 345 pg/mL  05/14/2023 Ferritin Low 1 ng/mL  05/14/2023 Iron Low 13 ug/dL  29/51/8841 TIBC High 660 ug/dL  63/05/6008 Iron saturation ratio Low 2 %   Patient is actively experiencing menstrual bleeding, thus decline in her Hgb levels is expected between now and surgery. Additionally, we would be amiss not to anticipate some blood loss during her surgery tomorrow. With that said, in efforts to optimize her anemia, patient was brought into the hospital in 05/14/2023 for PRBC transfusion (plans for 2-3 units). Following 2 units of PRBCs, patient's Hgb increased to 9.8 g/dL with a hematocrit of 93.2%.  Impression and Plan:  Karen Walton scheduled for gynecological surgery tomorrow (05/15/2023) for definitive treatment of her menorrhagia, uterine leiomyoma, and resulting acute on chronic blood loss  anemia. Patent significantly anemic with Hgb of 6.5 g/dL. Patient was brought in prior to surgery for additional labs and transfusion of 2 units of PRBCs. Following blood transfusion, patient's Hgb increased to 9.8 g/dL, which is above where I had intended her to be after just 2 units. 3rd unit as ordered,  however we did not transfuse due to appropriate response to initial 2 units of blood. Current Hgb level provides [place] with some cushion for continued blood loss over night and for potential blood loss during surgery.  Additionally, platelet count has also improved to 851 K/uL. Should additional blood products be required tomorrow, there is an available used already crossmatched and ready to be transfused if needed.  Additional lab studies today reveal that patient basically has no Fe stores despite supplementation with oral FeSO4 325 mg daily.  Her Fe saturation found to be at a mere 2% with a TIBC of 556 ug/dL. Obviously, it will take so time for patient to replete her Fe stores after surgery given her current level of depletion. Will plan on increasing FeSO4 325 mg to BID; current medication list updated to reflect change in doing. Patient encouraged to take the supplements with a source of Vitamin C to promote absorption. Additionally, concurrent use of stool softener, increased PO fluid intake, and increased activity (in line with postoperative restrictions from surgery) will be recommended to prevent constipation associated with Fe supplementation. Patient reminded that she is not going to want to strain/bear down for bowel movements postoperatively, therefore the aforementioned measures are in her best interest to prevent her from becoming constipated. Additionally, I am unsure of plans for postoperative pain control, however opioid based therapy will only stand to further set patient up for developing constipation.   Once surgery has been completed tomorrow, she will need to have labs rechecked,  either with OB/GYN or PCP, to follow up on effectiveness and determine need for continued dose escalated Fe therapy (325 mg BID). Again, could also consider parenteral Fe replacement if patient does not appropriately respond to oral replacement. Anemia workup has been essentially completed and patient is already on  oral Fe. These things are generally prerequisites for insurance coverage of infusion treatments. With everything already completed, there should be no issues with getting parenteral Fe infusions approved through patient's insurance at this point if Dr. Jean Rosenthal and/or her PCP feel as if this care measure is appropriate. In the interim, importance of using oral supplements, and incorporating Fe rich foods into diet (meats, beans, nuts, fortified cereals, green leafy vegetables, etc), has been stressed.   Anemia has been appropriately optimized and patient is set to proceed with surgery tomorrow as planned. Post-transfusion VSS; BP 123/80 mmHg with a heart rate of 77 bpm. Overall, feelings of fatigue and shortness of breath have improved. No further needs from PAT department at this point. She is being discharged from [facility] in stable condition. Should further perioperative needs be identified, please feel free to reach out to our department.   Encounter Diagnoses: Encounter for blood transfusion Microcytic hypochromic anemia  Menorrhagia with regular cycle Pre-operative laboratory examination  Quentin Mulling, MSN, APRN, FNP-C, CEN [facility]  Perioperative Services Nurse Practitioner Phone: [phone] Fax: [phone] 05/14/23 3:57 PM  NOTE: This note has been prepared using Dragon dictation software. Despite my best ability to proofread, there is always the potential that unintentional transcriptional errors may still occur from this process.

## 2023-05-15 ENCOUNTER — Encounter: Payer: Self-pay | Admitting: Obstetrics and Gynecology

## 2023-05-15 ENCOUNTER — Ambulatory Visit
Admission: RE | Admit: 2023-05-15 | Discharge: 2023-05-15 | Disposition: A | Discharge status: Home / Self Care | Payer: BC Managed Care – PPO | Attending: Obstetrics and Gynecology | Admitting: Obstetrics and Gynecology

## 2023-05-15 ENCOUNTER — Other Ambulatory Visit: Payer: Self-pay

## 2023-05-15 ENCOUNTER — Ambulatory Visit: Payer: BC Managed Care – PPO | Admitting: Urgent Care

## 2023-05-15 ENCOUNTER — Encounter
Admission: RE | Disposition: A | Discharge status: Home / Self Care | Payer: Self-pay | Source: Home / Self Care | Attending: Obstetrics and Gynecology

## 2023-05-15 DIAGNOSIS — N72 Inflammatory disease of cervix uteri: Secondary | ICD-10-CM | POA: Insufficient documentation

## 2023-05-15 DIAGNOSIS — D251 Intramural leiomyoma of uterus: Secondary | ICD-10-CM | POA: Insufficient documentation

## 2023-05-15 DIAGNOSIS — N838 Other noninflammatory disorders of ovary, fallopian tube and broad ligament: Secondary | ICD-10-CM | POA: Insufficient documentation

## 2023-05-15 DIAGNOSIS — D500 Iron deficiency anemia secondary to blood loss (chronic): Secondary | ICD-10-CM | POA: Diagnosis present

## 2023-05-15 DIAGNOSIS — Z01818 Encounter for other preprocedural examination: Secondary | ICD-10-CM

## 2023-05-15 DIAGNOSIS — N7011 Chronic salpingitis: Secondary | ICD-10-CM | POA: Insufficient documentation

## 2023-05-15 DIAGNOSIS — D509 Iron deficiency anemia, unspecified: Secondary | ICD-10-CM

## 2023-05-15 DIAGNOSIS — N920 Excessive and frequent menstruation with regular cycle: Secondary | ICD-10-CM

## 2023-05-15 DIAGNOSIS — Z5189 Encounter for other specified aftercare: Secondary | ICD-10-CM

## 2023-05-15 DIAGNOSIS — Z01812 Encounter for preprocedural laboratory examination: Secondary | ICD-10-CM

## 2023-05-15 DIAGNOSIS — Z9889 Other specified postprocedural states: Secondary | ICD-10-CM

## 2023-05-15 DIAGNOSIS — D62 Acute posthemorrhagic anemia: Secondary | ICD-10-CM | POA: Insufficient documentation

## 2023-05-15 DIAGNOSIS — D259 Leiomyoma of uterus, unspecified: Secondary | ICD-10-CM | POA: Diagnosis present

## 2023-05-15 DIAGNOSIS — Z9851 Tubal ligation status: Secondary | ICD-10-CM | POA: Insufficient documentation

## 2023-05-15 DIAGNOSIS — D75839 Thrombocytosis, unspecified: Secondary | ICD-10-CM | POA: Insufficient documentation

## 2023-05-15 HISTORY — DX: Nausea with vomiting, unspecified: Z98.890

## 2023-05-15 HISTORY — PX: CYSTOSCOPY: SHX5120

## 2023-05-15 HISTORY — PX: ROBOTIC ASSISTED LAPAROSCOPIC HYSTERECTOMY AND SALPINGECTOMY: SHX6379

## 2023-05-15 LAB — CBC
HCT: 31.2 % — ABNORMAL LOW (ref 36.0–46.0)
Hemoglobin: 9.6 g/dL — ABNORMAL LOW (ref 12.0–15.0)
MCH: 22.3 pg — ABNORMAL LOW (ref 26.0–34.0)
MCHC: 30.8 g/dL (ref 30.0–36.0)
MCV: 72.6 fL — ABNORMAL LOW (ref 80.0–100.0)
Platelets: 842 10*3/uL — ABNORMAL HIGH (ref 150–400)
RBC: 4.3 MIL/uL (ref 3.87–5.11)
RDW: 24.3 % — ABNORMAL HIGH (ref 11.5–15.5)
WBC: 6.1 10*3/uL (ref 4.0–10.5)
nRBC: 0 % (ref 0.0–0.2)

## 2023-05-15 LAB — POCT PREGNANCY, URINE: Preg Test, Ur: NEGATIVE

## 2023-05-15 SURGERY — XI ROBOTIC ASSISTED LAPAROSCOPIC HYSTERECTOMY AND SALPINGECTOMY
Anesthesia: General

## 2023-05-15 MED ORDER — ONDANSETRON HCL 4 MG/2ML IJ SOLN
INTRAMUSCULAR | Status: DC | PRN
  Administered 2023-05-15: 4 mg via INTRAVENOUS

## 2023-05-15 MED ORDER — EPHEDRINE SULFATE-NACL 50-0.9 MG/10ML-% IV SOSY
PREFILLED_SYRINGE | INTRAVENOUS | Status: DC | PRN
  Administered 2023-05-15: 5 mg via INTRAVENOUS

## 2023-05-15 MED ORDER — OXYCODONE-ACETAMINOPHEN 5-325 MG PO TABS: 1.0000 | ORAL_TABLET | Freq: Four times a day (QID) | ORAL | 0 refills | Status: DC | PRN

## 2023-05-15 MED ORDER — LIDOCAINE HCL (CARDIAC) PF 100 MG/5ML IV SOSY
PREFILLED_SYRINGE | INTRAVENOUS | Status: DC | PRN
  Administered 2023-05-15: 60 mg via INTRAVENOUS

## 2023-05-15 MED ORDER — PROPOFOL 10 MG/ML IV BOLUS
INTRAVENOUS | Status: DC | PRN
  Administered 2023-05-15: 180 mg via INTRAVENOUS

## 2023-05-15 MED ORDER — ROCURONIUM BROMIDE 100 MG/10ML IV SOLN
INTRAVENOUS | Status: DC | PRN
  Administered 2023-05-15: 50 mg via INTRAVENOUS
  Administered 2023-05-15: 30 mg via INTRAVENOUS

## 2023-05-15 MED ORDER — DROPERIDOL 2.5 MG/ML IJ SOLN
0.6250 mg | Freq: Once | INTRAMUSCULAR | Status: AC | PRN
  Administered 2023-05-15: 0.625 mg via INTRAVENOUS

## 2023-05-15 MED ORDER — CHLORHEXIDINE GLUCONATE 0.12 % MT SOLN
OROMUCOSAL | Status: AC
  Filled 2023-05-15: qty 15

## 2023-05-15 MED ORDER — DROPERIDOL 2.5 MG/ML IJ SOLN
INTRAMUSCULAR | Status: AC
  Filled 2023-05-15: qty 2

## 2023-05-15 MED ORDER — EPHEDRINE 5 MG/ML INJ
INTRAVENOUS | Status: AC
  Filled 2023-05-15: qty 5

## 2023-05-15 MED ORDER — HYDROMORPHONE HCL 1 MG/ML IJ SOLN
INTRAMUSCULAR | Status: DC | PRN
  Administered 2023-05-15: .5 mg via INTRAVENOUS

## 2023-05-15 MED ORDER — CEFAZOLIN SODIUM-DEXTROSE 2-4 GM/100ML-% IV SOLN
INTRAVENOUS | Status: AC
  Filled 2023-05-15: qty 100

## 2023-05-15 MED ORDER — PHENYLEPHRINE 80 MCG/ML (10ML) SYRINGE FOR IV PUSH (FOR BLOOD PRESSURE SUPPORT)
PREFILLED_SYRINGE | INTRAVENOUS | Status: DC | PRN
  Administered 2023-05-15: 80 ug via INTRAVENOUS

## 2023-05-15 MED ORDER — ESMOLOL HCL 100 MG/10ML IV SOLN
INTRAVENOUS | Status: DC | PRN
  Administered 2023-05-15: 20 mg via INTRAVENOUS

## 2023-05-15 MED ORDER — 0.9 % SODIUM CHLORIDE (POUR BTL) OPTIME
TOPICAL | Status: DC | PRN
  Administered 2023-05-15: 500 mL

## 2023-05-15 MED ORDER — BUPIVACAINE HCL (PF) 0.5 % IJ SOLN
INTRAMUSCULAR | Status: AC
  Filled 2023-05-15: qty 30

## 2023-05-15 MED ORDER — HYDROMORPHONE HCL 1 MG/ML IJ SOLN
INTRAMUSCULAR | Status: AC
  Filled 2023-05-15: qty 1

## 2023-05-15 MED ORDER — OXYCODONE HCL 5 MG PO TABS
ORAL_TABLET | ORAL | Status: AC
  Filled 2023-05-15: qty 1

## 2023-05-15 MED ORDER — IBUPROFEN 600 MG PO TABS: 600.0000 mg | ORAL_TABLET | Freq: Four times a day (QID) | ORAL | 0 refills | Status: DC

## 2023-05-15 MED ORDER — PHENYLEPHRINE 80 MCG/ML (10ML) SYRINGE FOR IV PUSH (FOR BLOOD PRESSURE SUPPORT)
PREFILLED_SYRINGE | INTRAVENOUS | Status: AC
  Filled 2023-05-15: qty 10

## 2023-05-15 MED ORDER — SUGAMMADEX SODIUM 200 MG/2ML IV SOLN
INTRAVENOUS | Status: DC | PRN
  Administered 2023-05-15: 200 mg via INTRAVENOUS

## 2023-05-15 MED ORDER — LIDOCAINE HCL (PF) 2 % IJ SOLN
INTRAMUSCULAR | Status: AC
Start: 2023-05-15 — End: ?
  Filled 2023-05-15: qty 5

## 2023-05-15 MED ORDER — MIDAZOLAM HCL 2 MG/2ML IJ SOLN
INTRAMUSCULAR | Status: AC
  Filled 2023-05-15: qty 2

## 2023-05-15 MED ORDER — OXYCODONE HCL 5 MG/5ML PO SOLN: 5.0000 mg | Freq: Once | ORAL | Status: AC | PRN

## 2023-05-15 MED ORDER — FENTANYL CITRATE (PF) 100 MCG/2ML IJ SOLN
INTRAMUSCULAR | Status: DC | PRN
  Administered 2023-05-15 (×2): 50 ug via INTRAVENOUS

## 2023-05-15 MED ORDER — BUPIVACAINE HCL 0.5 % IJ SOLN
INTRAMUSCULAR | Status: DC | PRN
  Administered 2023-05-15: 30 mL

## 2023-05-15 MED ORDER — FENTANYL CITRATE (PF) 100 MCG/2ML IJ SOLN
INTRAMUSCULAR | Status: AC
  Filled 2023-05-15: qty 2

## 2023-05-15 MED ORDER — ONDANSETRON 4 MG PO TBDP: 4.0000 mg | ORAL_TABLET | Freq: Four times a day (QID) | ORAL | 0 refills | Status: DC | PRN

## 2023-05-15 MED ORDER — DEXAMETHASONE SODIUM PHOSPHATE 10 MG/ML IJ SOLN
INTRAMUSCULAR | Status: AC
  Filled 2023-05-15: qty 1

## 2023-05-15 MED ORDER — OXYCODONE HCL 5 MG PO TABS
5.0000 mg | ORAL_TABLET | Freq: Once | ORAL | Status: AC | PRN
  Administered 2023-05-15: 5 mg via ORAL

## 2023-05-15 MED ORDER — PROPOFOL 10 MG/ML IV BOLUS
INTRAVENOUS | Status: AC
  Filled 2023-05-15: qty 20

## 2023-05-15 MED ORDER — KETOROLAC TROMETHAMINE 30 MG/ML IJ SOLN
INTRAMUSCULAR | Status: DC | PRN
  Administered 2023-05-15: 30 mg via INTRAVENOUS

## 2023-05-15 MED ORDER — FENTANYL CITRATE (PF) 100 MCG/2ML IJ SOLN
25.0000 ug | INTRAMUSCULAR | Status: DC | PRN
  Administered 2023-05-15 (×2): 25 ug via INTRAVENOUS

## 2023-05-15 MED ORDER — LACTATED RINGERS IV SOLN: INTRAVENOUS | Status: DC | PRN

## 2023-05-15 MED ORDER — ONDANSETRON HCL 4 MG/2ML IJ SOLN
INTRAMUSCULAR | Status: AC
  Filled 2023-05-15: qty 2

## 2023-05-15 MED ORDER — MIDAZOLAM HCL 2 MG/2ML IJ SOLN
INTRAMUSCULAR | Status: DC | PRN
  Administered 2023-05-15: 2 mg via INTRAVENOUS

## 2023-05-15 MED ORDER — DEXAMETHASONE SODIUM PHOSPHATE 10 MG/ML IJ SOLN
INTRAMUSCULAR | Status: DC | PRN
  Administered 2023-05-15: 5 mg via INTRAVENOUS

## 2023-05-15 MED ORDER — METOPROLOL TARTRATE 5 MG/5ML IV SOLN
INTRAVENOUS | Status: AC
  Filled 2023-05-15: qty 5

## 2023-05-15 SURGICAL SUPPLY — 65 items
APPLICATOR ARISTA FLEXITIP XL (MISCELLANEOUS) IMPLANT
BAG URINE DRAIN 2000ML AR STRL (UROLOGICAL SUPPLIES) ×2 IMPLANT
BLADE SURG 15 STRL LF DISP TIS (BLADE) ×2 IMPLANT
BLADE SURG SZ11 CARB STEEL (BLADE) ×2 IMPLANT
CANNULA CAP OBTURATR AIRSEAL 8 (CAP) ×2 IMPLANT
CATH URTH 16FR FL 2W BLN LF (CATHETERS) ×2 IMPLANT
COVER TIP SHEARS 8 DVNC (MISCELLANEOUS) ×2 IMPLANT
DERMABOND ADVANCED .7 DNX12 (GAUZE/BANDAGES/DRESSINGS) ×2 IMPLANT
DRAPE ARM DVNC X/XI (DISPOSABLE) ×8 IMPLANT
DRAPE COLUMN DVNC XI (DISPOSABLE) ×2 IMPLANT
DRAPE UNDER BUTTOCK W/FLU (DRAPES) ×2 IMPLANT
DRIVER NDL MEGA SUTCUT DVNCXI (INSTRUMENTS) ×2 IMPLANT
DRIVER NDLE MEGA SUTCUT DVNCXI (INSTRUMENTS) ×2 IMPLANT
ELECT REM PT RETURN 9FT ADLT (ELECTROSURGICAL) ×2
ELECTRODE REM PT RTRN 9FT ADLT (ELECTROSURGICAL) ×2 IMPLANT
FORCEPS BPLR FENES DVNC XI (FORCEP) ×2 IMPLANT
FORCEPS BPLR R/ABLATION 8 DVNC (INSTRUMENTS) ×2 IMPLANT
GAUZE 4X4 16PLY ~~LOC~~+RFID DBL (SPONGE) ×2 IMPLANT
GLOVE BIO SURGEON STRL SZ7 (GLOVE) ×6 IMPLANT
GLOVE BIOGEL PI IND STRL 7.5 (GLOVE) ×6 IMPLANT
GOWN STRL REUS W/ TWL LRG LVL3 (GOWN DISPOSABLE) ×6 IMPLANT
GOWN STRL REUS W/ TWL XL LVL3 (GOWN DISPOSABLE) ×2 IMPLANT
HEMOSTAT ARISTA ABSORB 3G PWDR (HEMOSTASIS) IMPLANT
IRRIGATION STRYKERFLOW (MISCELLANEOUS) IMPLANT
IRRIGATOR STRYKERFLOW (MISCELLANEOUS)
IRRIGATOR SUCT 8 DISP DVNC XI (IRRIGATION / IRRIGATOR) IMPLANT
IV LACTATED RINGERS 1000ML (IV SOLUTION) ×2 IMPLANT
IV NS 1000ML BAXH (IV SOLUTION) ×2 IMPLANT
KIT PINK PAD W/HEAD ARE REST (MISCELLANEOUS) ×2
KIT PINK PAD W/HEAD ARM REST (MISCELLANEOUS) ×2 IMPLANT
KIT TURNOVER CYSTO (KITS) ×2 IMPLANT
LABEL OR SOLS (LABEL) ×2 IMPLANT
MANIFOLD NEPTUNE II (INSTRUMENTS) ×2 IMPLANT
MANIPULATOR VCARE LG CRV RETR (MISCELLANEOUS) IMPLANT
MANIPULATOR VCARE SML CRV RETR (MISCELLANEOUS) IMPLANT
MANIPULATOR VCARE STD CRV RETR (MISCELLANEOUS) IMPLANT
NDL HYPO 22X1.5 SAFETY MO (MISCELLANEOUS) ×2 IMPLANT
NEEDLE HYPO 22X1.5 SAFETY MO (MISCELLANEOUS) ×2 IMPLANT
NS IRRIG 500ML POUR BTL (IV SOLUTION) ×2 IMPLANT
OBTURATOR OPTICAL STND 8 DVNC (TROCAR) ×2
OBTURATOR OPTICALSTD 8 DVNC (TROCAR) ×2 IMPLANT
OCCLUDER COLPOPNEUMO (BALLOONS) ×2 IMPLANT
PACK GYN LAPAROSCOPIC (MISCELLANEOUS) ×2 IMPLANT
PAD PREP OB/GYN DISP 24X41 (PERSONAL CARE ITEMS) ×2 IMPLANT
SCISSORS MNPLR CVD DVNC XI (INSTRUMENTS) ×2 IMPLANT
SCRUB CHG 4% DYNA-HEX 4OZ (MISCELLANEOUS) ×2 IMPLANT
SEAL UNIV 5-12 XI (MISCELLANEOUS) ×6 IMPLANT
SEALER VESSEL EXT DVNC XI (MISCELLANEOUS) IMPLANT
SET CYSTO W/LG BORE CLAMP LF (SET/KITS/TRAYS/PACK) ×2 IMPLANT
SET TUBE FILTERED XL AIRSEAL (SET/KITS/TRAYS/PACK) ×2 IMPLANT
SOL ELECTROSURG ANTI STICK (MISCELLANEOUS) ×2
SOL PREP PVP 2OZ (MISCELLANEOUS) ×2
SOLUTION ELECTROSURG ANTI STCK (MISCELLANEOUS) ×2 IMPLANT
SOLUTION PREP PVP 2OZ (MISCELLANEOUS) ×2 IMPLANT
SPONGE T-LAP 18X18 ~~LOC~~+RFID (SPONGE) IMPLANT
SURGILUBE 2OZ TUBE FLIPTOP (MISCELLANEOUS) ×2 IMPLANT
SUT MNCRL 4-0 27XMFL (SUTURE) ×2
SUT STRATA PDS 0 30 CT-2.5 (SUTURE) ×2 IMPLANT
SUT STRATAFIX SPIRAL PDS+ 0 30 (SUTURE) IMPLANT
SUT VIC AB 0 CT2 27 (SUTURE) ×2 IMPLANT
SUTURE MNCRL 4-0 27XMF (SUTURE) ×2 IMPLANT
SYR 10ML LL (SYRINGE) ×2 IMPLANT
SYR 50ML LL SCALE MARK (SYRINGE) ×2 IMPLANT
TRAP FLUID SMOKE EVACUATOR (MISCELLANEOUS) ×2 IMPLANT
WATER STERILE IRR 500ML POUR (IV SOLUTION) ×2 IMPLANT

## 2023-05-15 NOTE — Anesthesia Procedure Notes (Signed)
Procedure Name: Intubation Date/Time: 05/15/2023 9:23 AM  Performed by: Monico Hoar, CRNAPre-anesthesia Checklist: Patient identified, Patient being monitored, Timeout performed, Emergency Drugs available and Suction available Patient Re-evaluated:Patient Re-evaluated prior to induction Oxygen Delivery Method: Circle system utilized Preoxygenation: Pre-oxygenation with 100% oxygen Induction Type: IV induction Ventilation: Mask ventilation without difficulty Laryngoscope Size: Mac and 4 Grade View: Grade I Tube type: Oral Tube size: 7.0 mm Number of attempts: 1 Airway Equipment and Method: Stylet Placement Confirmation: ETT inserted through vocal cords under direct vision, positive ETCO2 and breath sounds checked- equal and bilateral Secured at: 21 cm Tube secured with: Tape Dental Injury: Teeth and Oropharynx as per pre-operative assessment

## 2023-05-15 NOTE — Op Note (Signed)
Operative Note    Name: Karen Walton  Date of Service: 05/15/2023   DOB: August 06, 1985  MRN: [id number]    Pre-Operative Diagnosis:  1) Menorrhagia with regular cycle 2) Anemia due to chronic blood loss 3) Fibroid Uterus  Post-Operative Diagnosis:  1) Menorrhagia with regular cycle 2) Anemia due to chronic blood loss 3) Fibroid Uterus  Procedures:  1) Robot assisted Total Laparoscopic Hysterectomy, bilateral salpingectomy  2) Cystoscopy  Primary Surgeon: [name], MD  Assistant Surgeon: Christeen Douglas, MD   EBL: 20 mL   IVF: 800 mL   Urine output: 800 mL  Specimens: Uterus (with fibroids), cervix, and bilateral fallopian tubes  Drains: none  Complications: None   Disposition: PACU   Condition: Stable   Findings:  1) enlarged uterus with fibroids, most prominent on right lateral side 2) normal appearing ovaries 3) Fallopian tubes with changes consistent with tubal ligation 4) on cystoscopy, no evidence of bladder damage, normal efflux of urine through the bilateral ureteral orifices.  Procedure Summary:  The patient was taken to the operating room where general anesthesia was administered and found to be adequate. She was placed in the dorsal supine lithotomy position in Berwind stirrups and prepped and draped in the usual, sterile fashion. After a timeout was called an indwelling catheter was placed in her bladder.  A sterile speculum was placed in her vagina.  The anterior lip of the cervix was grasped with the single-tooth tenaculum.  The cervix was serially dilated to an 11 Pratt dilator.  The large Vcare device was placed in accordance to the manufacturer's recommendations.  The tenaculum and speculum were removed.   Attention was turned to the abdomen where after injection of local anesthetic, an 8 mm supraumbilical incision was made with the scalpel. Entry into the abdomen was obtained via Optiview trocar technique (a blunt entry technique with camera  visualization through the obturator upon entry). Verification of entry into the abdomen was obtained using opening pressures. The abdomen was insufflated with CO2. The camera was introduced through the trocar with verification of atraumatic entry.  Right and left abdominal entry sites were created after injection of local anesthetic about 8 cm lateral to the umbilical port in accordance with the Intuitive manufacturer's recommendations.  The port sites were 8 mm.  The intuitive trochars were introduced under intra-abdominal camera visualization without difficulty   The XI robot was docked on the patient's left.  Clearance was verified from the patient's legs.  Through the umbilical port the camera was placed.  Through the port attached to arm 4 the monopolar scissors were placed.  Through the port attached to arm 2 the fenestrated bipolar forceps were was placed.     An inspection was undertaken of the pelvis with the above-noted findings.  Adhesions of the omentum to the umbilicus were noted.  The camera was placed through arm for and the scissors were placed in arm 3.  The adhesions were taken down with great care and hemostasis was noted.  The instruments and camera were placed in their original locations.  The bilateral ureters were identified and found to be well away from the operative area of interest.   The bladder flap was created without difficulty.  The right round ligament was grasped, cauterized, and transected completely with the scissors.  The anterior leaf of the broad ligament was scored with electrocautery and then opened superficially.  The left broad ligament was similarly opened and the left anterior broad ligament was scored and  opened connecting both sides to the bladder flap dissection.  The right fallopian tube was elevated and transected along the mesosalpinx until it was freed from its connections with only a segment remaining attached near the right cornual area.  The same procedure  was carried out on the left side.  The right utero-ovarian ligament was cauterized and transected and connected to the area of dissection of the round ligament.  The same procedure was carried out on the left side.  The left uterine artery was skeletonized, cauterized, and dissected away from the vagina until it was clear of the [facility] ring.  The same procedure was carried out on the right side.  The colpotomy was performed using electrocautery in a circumferential pattern following the Koh ring.  The uterus was removed through the vagina intact, along with the fallopian tubes.  All lines of dissection were inspected and found to be hemostatic.  Closure of the vaginal cuff was undertaken using the 0 V-lock stitch in a running fashion.  All vascular pedicles were inspected and found to be hemostatic.  The pressure in the abdomen was lowered to 5 mmHg to verify ongoing hemostasis, which was present.  To ensure ongoing hemostasis, 3 g of Arista was placed along all vascular pedicles.  All instruments removed from the robotic ports.  The robot was undocked from the patient.  The abdomen was then desufflated of CO2 with the aid of 5 deep breaths from anesthesia.  All trochars were then removed.  All skin incisions were closed using 4-0 Vicryl in a subcuticular fashion and reinforced using surgical skin glue.   Cystoscopy was undertaken at this point. The Foley catheter was removed and the 30 cystoscope was gently introduced through the urethra. The bladder survey was undertaken with efflux of urine from both orifices noted. There were no defects noted in the bladder wall. The cystoscope was utilized to nearly empty the bladder.  A sterile speculum was placed in the vagina and the vaginal cuff closure was hemostatic.  The vagina was inspected to ensure that no instruments or sponges remained.  A digital sweep of the vagina was undertaken to ensure no instruments or sponges remained and this was verified to be  clear.  The assistant in this case performed placement of the uterine manipulator and placement of the Foley catheter.  Further, she performed manipulation of the uterus throughout the case.  She performed the cystoscopy.  She assisted with closure of the skin incisions.  The patient tolerated the procedure well.  Sponge, lap, needle, and instrument counts were correct x 2.  VTE prophylaxis: SCDs. Antibiotic prophylaxis: Ancef 2 grams IV prior to skin incision.  The assistant in this case was an MD due to the lack of another available qualified assistant.  The patient was awakened in the operating room and was taken to the PACU in stable condition.   [name], MD, [facility] OB/GY[facility] 05/15/2023 11:28 AM

## 2023-05-15 NOTE — Interval H&P Note (Signed)
History and Physical Interval Note:  05/15/2023 7:48 AM  Karen Walton  has presented today for surgery, with the diagnosis of menorrhagia with regular cycles, fibroids, anemia.  The various methods of treatment have been discussed with the patient and family. After consideration of risks, benefits and other options for treatment, the patient has consented to  Procedure(s): XI ROBOTIC ASSISTED LAPAROSCOPIC HYSTERECTOMY AND SALPINGECTOMY (Bilateral) CYSTOSCOPY (N/A) as a surgical intervention.  The patient's history has been reviewed, patient examined, no change in status, stable for surgery.  I have reviewed the patient's chart and labs.  Questions were answered to the patient's satisfaction.   She did receive 2 units pRBCs yesterday in an effort to increase her hemoglobin from 6.5. She feels well today with no symptoms. Her hemoglobin this morning is 9.5. So, will plan to move forward with the planned surgery.  All questions answered.    Karen Mohair, MD, [facility] 05/15/2023 7:49 AM

## 2023-05-15 NOTE — Anesthesia Postprocedure Evaluation (Signed)
Anesthesia Post Note  Patient: Karen Walton  Procedure(s) Performed: XI ROBOTIC ASSISTED LAPAROSCOPIC HYSTERECTOMY AND SALPINGECTOMY (Bilateral) CYSTOSCOPY  Patient location during evaluation: PACU Anesthesia Type: General Level of consciousness: awake and alert Pain management: pain level controlled Vital Signs Assessment: post-procedure vital signs reviewed and stable Respiratory status: spontaneous breathing, nonlabored ventilation and respiratory function stable Cardiovascular status: blood pressure returned to baseline and stable Postop Assessment: no apparent nausea or vomiting Anesthetic complications: yes Comments: Post-op nausea treated with droperidol   No notable events documented.   Last Vitals:  Vitals:   05/15/23 1240 05/15/23 1305  BP:  134/85  Pulse: 70 78  Resp: 13 16  Temp:  36.6 C  SpO2: 100% 100%    Last Pain:  Vitals:   05/15/23 1305  TempSrc: Temporal  PainSc: 3                  Foye Deer

## 2023-05-15 NOTE — Transfer of Care (Signed)
Immediate Anesthesia Transfer of Care Note  Patient: Karen Walton  Procedure(s) Performed: XI ROBOTIC ASSISTED LAPAROSCOPIC HYSTERECTOMY AND SALPINGECTOMY (Bilateral) CYSTOSCOPY  Patient Location: PACU  Anesthesia Type:General  Level of Consciousness: Sedated  Airway & Oxygen Therapy: Patient Spontanous Breathing and Patient connected to nasal cannula oxygen  Post-op Assessment: Report given to RN and Post -op Vital signs reviewed and stable  Post vital signs: Reviewed and stable  Last Vitals:  Vitals Value Taken Time  BP 131/77 05/15/23 1145  Temp    Pulse 78 05/15/23 1148  Resp 15 05/15/23 1148  SpO2 100 % 05/15/23 1148  Vitals shown include unfiled device data.  Last Pain:  Vitals:   05/15/23 0648  TempSrc: Temporal  PainSc: 0-No pain         Complications: No notable events documented.

## 2023-05-15 NOTE — Anesthesia Preprocedure Evaluation (Signed)
Anesthesia Evaluation  Patient identified by MRN, date of birth, ID band Patient awake    Reviewed: Allergy & Precautions, NPO status , Patient's Chart, lab work & pertinent test results  Airway Mallampati: I  TM Distance: >3 FB Neck ROM: full    Dental  (+) Dental Advidsory Given, Teeth Intact   Pulmonary neg pulmonary ROS   Pulmonary exam normal        Cardiovascular negative cardio ROS Normal cardiovascular exam     Neuro/Psych negative neurological ROS  negative psych ROS   GI/Hepatic negative GI ROS, Neg liver ROS,,,  Endo/Other  negative endocrine ROS    Renal/GU      Musculoskeletal   Abdominal   Peds  Hematology  (+) Blood dyscrasia, anemia   Anesthesia Other Findings Past Medical History: No date: Allergy No date: Anemia     Comment:  a.) acute on chronic blood loss anemia; b.) transfused 1              unit PRBCs 02/19/2023; started on oral FeSO4 325 mg daily No date: Dyspnea     Comment:  due to severe anemia No date: Menorrhagia with regular cycle No date: Uterine fibroid  Past Surgical History: 2014: CHOLECYSTECTOMY 2013: TUBAL LIGATION     Reproductive/Obstetrics negative OB ROS                             Anesthesia Physical Anesthesia Plan  ASA: 2  Anesthesia Plan: General ETT and General   Post-op Pain Management:    Induction: Intravenous  PONV Risk Score and Plan: 4 or greater and Ondansetron, Dexamethasone and Midazolam  Airway Management Planned: Oral ETT  Additional Equipment:   Intra-op Plan:   Post-operative Plan: Extubation in OR  Informed Consent: I have reviewed the patients History and Physical, chart, labs and discussed the procedure including the risks, benefits and alternatives for the proposed anesthesia with the patient or authorized representative who has indicated his/her understanding and acceptance.     Dental Advisory  Given  Plan Discussed with: Anesthesiologist, CRNA and Surgeon  Anesthesia Plan Comments: (Patient consented for risks of anesthesia including but not limited to:  - adverse reactions to medications - damage to eyes, teeth, lips or other oral mucosa - nerve damage due to positioning  - sore throat or hoarseness - Damage to heart, brain, nerves, lungs, other parts of body or loss of life  Patient voiced understanding and assent.)       Anesthesia Quick Evaluation

## 2023-05-16 ENCOUNTER — Encounter: Payer: Self-pay | Admitting: Obstetrics and Gynecology

## 2023-05-16 LAB — TYPE AND SCREEN
ABO/RH(D): A POS
Antibody Screen: NEGATIVE
Extend sample reason: TRANSFUSED
Unit division: 0
Unit division: 0
Unit division: 0

## 2023-05-16 LAB — BPAM RBC
Blood Product Expiration Date: 202501152359
Blood Product Expiration Date: 202501152359
Blood Product Expiration Date: 202501152359
ISSUE DATE / TIME: 202412190826
ISSUE DATE / TIME: 202412191122
Unit Type and Rh: 6200
Unit Type and Rh: 6200
Unit Type and Rh: 6200

## 2023-05-16 LAB — PREPARE RBC (CROSSMATCH)

## 2023-05-18 LAB — SURGICAL PATHOLOGY

## 2024-05-02 ENCOUNTER — Encounter: Admitting: Nurse Practitioner

## 2024-06-03 ENCOUNTER — Encounter: Payer: Self-pay | Admitting: Nurse Practitioner

## 2024-06-03 ENCOUNTER — Ambulatory Visit (INDEPENDENT_AMBULATORY_CARE_PROVIDER_SITE_OTHER): Admitting: Nurse Practitioner

## 2024-06-03 VITALS — BP 127/83 | HR 80 | Temp 98.1°F | Ht 61.81 in | Wt 186.4 lb

## 2024-06-03 DIAGNOSIS — Z113 Encounter for screening for infections with a predominantly sexual mode of transmission: Secondary | ICD-10-CM

## 2024-06-03 DIAGNOSIS — Z0000 Encounter for general adult medical examination without abnormal findings: Secondary | ICD-10-CM | POA: Diagnosis not present

## 2024-06-03 DIAGNOSIS — Z136 Encounter for screening for cardiovascular disorders: Secondary | ICD-10-CM | POA: Diagnosis not present

## 2024-06-03 NOTE — Progress Notes (Signed)
 "  BP 127/83 (BP Location: Left Arm, Patient Position: Sitting, Cuff Size: Normal)   Pulse 80   Temp 98.1 F (36.7 C) (Oral)   Ht 5' 1.81 (1.57 m)   Wt 186 lb 6.4 oz (84.6 kg)   LMP 04/12/2023   SpO2 97%   BMI 34.30 kg/m    Subjective:    Patient ID: Karen Walton, female    DOB: 1985/09/28, 39 y.o.   MRN: [id number]  HPI: Karen Walton is a 39 y.o. female presenting on 06/03/2024 for comprehensive medical examination. Current medical complaints include:none  She currently lives with: Menopausal Symptoms: no  Depression Screen done today and results listed below:     06/03/2024   10:22 AM 02/19/2023    8:41 AM 05/30/2022    3:31 PM 02/11/2022    3:24 PM 04/01/2021   11:24 AM  Depression screen PHQ 2/9  Decreased Interest 0 0 0 1 1  Down, Depressed, Hopeless 0 1 0 0 0  PHQ - 2 Score 0 1 0 1 1  Altered sleeping 0 0 0 0 0  Tired, decreased energy 1 0 0 1 1  Change in appetite 0 0 0 0 0  Feeling bad or failure about yourself  0 0 0 0 1  Trouble concentrating 0 0 0 0 0  Moving slowly or fidgety/restless 0 0 0 0 0  Suicidal thoughts 0 0 0 0 0  PHQ-9 Score 1 1  0  2  3   Difficult doing work/chores Not difficult at all Not difficult at all Not difficult at all Not difficult at all Not difficult at all     Data saved with a previous flowsheet row definition    The patient does not have a history of falls. I did complete a risk assessment for falls. A plan of care for falls was documented.   Past Medical History:  Past Medical History:  Diagnosis Date   Allergy    Anemia    a.) acute on chronic blood loss anemia; b.) transfused 1 unit PRBCs 02/19/2023; started on oral FeSO4 325 mg daily; c.) transfused 2 units PRBCs 05/14/2023   Dyspnea    a.) associated with severe anemia   Menorrhagia with regular cycle    PONV (postoperative nausea and vomiting) 05/15/2023   a.) Tx'd with IV droperidol    Uterine fibroid     Surgical History:  Past Surgical History:   Procedure Laterality Date   CHOLECYSTECTOMY  2014   CYSTOSCOPY N/A 05/15/2023   Procedure: CYSTOSCOPY;  Surgeon: Leonce Garnette BIRCH, MD;  Location: [facility];  Service: Gynecology;  Laterality: N/A;   ROBOTIC ASSISTED LAPAROSCOPIC HYSTERECTOMY AND SALPINGECTOMY Bilateral 05/15/2023   Procedure: XI ROBOTIC ASSISTED LAPAROSCOPIC HYSTERECTOMY AND SALPINGECTOMY;  Surgeon: Leonce Garnette BIRCH, MD;  Location: [facility];  Service: Gynecology;  Laterality: Bilateral;   TUBAL LIGATION  2013    Medications:  Current Outpatient Medications on File Prior to Visit  Medication Sig   ferrous sulfate  325 (65 FE) MG EC tablet Take 1 tablet (325 mg total) by mouth 2 (two) times daily. take with source of vitamin C to promote/enhance absorption.   No current facility-administered medications on file prior to visit.    Allergies:  Allergies[1]  Social History:  Social History   Socioeconomic History   Marital status: Single    Spouse name: Not on file   Number of children: Not on file   Years of education: Not on file   Highest  education level: Not on file  Occupational History   Not on file  Tobacco Use   Smoking status: Never   Smokeless tobacco: Never  Vaping Use   Vaping status: Never Used  Substance and Sexual Activity   Alcohol use: Yes    Alcohol/week: 2.0 standard drinks of alcohol    Types: 2 Glasses of wine per week    Comment: Socially   Drug use: No   Sexual activity: Yes    Birth control/protection: Surgical  Other Topics Concern   Not on file  Social History Narrative   Not on file   Social Drivers of Health   Tobacco Use: Low Risk (06/03/2024)   Patient History    Smoking Tobacco Use: Never    Smokeless Tobacco Use: Never    Passive Exposure: Not on file  Financial Resource Strain: Not on file  Food Insecurity: Not on file  Transportation Needs: Not on file  Physical Activity: Not on file  Stress: Not on file  Social Connections: Not on file  Intimate Partner  Violence: Not on file  Depression (PHQ2-9): Low Risk (06/03/2024)   Depression (PHQ2-9)    PHQ-2 Score: 1  Alcohol Screen: Not on file  Housing: Not on file  Utilities: Not on file  Health Literacy: Not on file   Tobacco Use History[2] Social History   Substance and Sexual Activity  Alcohol Use Yes   Alcohol/week: 2.0 standard drinks of alcohol   Types: 2 Glasses of wine per week   Comment: Socially    Family History:  Family History  Problem Relation Age of Onset   Cancer Paternal Grandfather        brain   Diabetes Mother    Hypertension Mother    Hyperlipidemia Sister    Hypertension Sister    Depression Maternal Grandmother    Osteoporosis Maternal Grandmother    Brain cancer Maternal Grandfather    Diabetes Paternal Grandmother    COPD Paternal Grandmother    Congestive Heart Failure Paternal Grandmother    Thyroid  disease Neg Hx     Past medical history, surgical history, medications, allergies, family history and social history reviewed with patient today and changes made to appropriate areas of the chart.   Review of Systems  All other systems reviewed and are negative.  All other ROS negative except what is listed above and in the HPI.      Objective:    BP 127/83 (BP Location: Left Arm, Patient Position: Sitting, Cuff Size: Normal)   Pulse 80   Temp 98.1 F (36.7 C) (Oral)   Ht 5' 1.81 (1.57 m)   Wt 186 lb 6.4 oz (84.6 kg)   LMP 04/12/2023   SpO2 97%   BMI 34.30 kg/m   Wt Readings from Last 3 Encounters:  06/03/24 186 lb 6.4 oz (84.6 kg)  05/07/23 164 lb 7.4 oz (74.6 kg)  02/20/23 160 lb (72.6 kg)    Physical Exam Vitals and nursing note reviewed.  Constitutional:      General: She is awake. She is not in acute distress.    Appearance: Normal appearance. She is well-developed. She is not ill-appearing.  HENT:     Head: Normocephalic and atraumatic.     Right Ear: Hearing, tympanic membrane, ear canal and external ear normal. No drainage.      Left Ear: Hearing, tympanic membrane, ear canal and external ear normal. No drainage.     Nose: Nose normal.     Right Sinus:  No maxillary sinus tenderness or frontal sinus tenderness.     Left Sinus: No maxillary sinus tenderness or frontal sinus tenderness.     Mouth/Throat:     Mouth: Mucous membranes are moist.     Pharynx: Oropharynx is clear. Uvula midline. No pharyngeal swelling, oropharyngeal exudate or posterior oropharyngeal erythema.  Eyes:     General: Lids are normal.        Right eye: No discharge.        Left eye: No discharge.     Extraocular Movements: Extraocular movements intact.     Conjunctiva/sclera: Conjunctivae normal.     Pupils: Pupils are equal, round, and reactive to light.     Visual Fields: Right eye visual fields normal and left eye visual fields normal.  Neck:     Thyroid : No thyromegaly.     Vascular: No carotid bruit.     Trachea: Trachea normal.  Cardiovascular:     Rate and Rhythm: Normal rate and regular rhythm.     Heart sounds: Normal heart sounds. No murmur heard.    No gallop.  Pulmonary:     Effort: Pulmonary effort is normal. No accessory muscle usage or respiratory distress.     Breath sounds: Normal breath sounds.  Chest:  Breasts:    Right: Normal.     Left: Normal.  Abdominal:     General: Bowel sounds are normal.     Palpations: Abdomen is soft. There is no hepatomegaly or splenomegaly.     Tenderness: There is no abdominal tenderness.  Musculoskeletal:        General: Normal range of motion.     Cervical back: Normal range of motion and neck supple.     Right lower leg: No edema.     Left lower leg: No edema.  Lymphadenopathy:     Head:     Right side of head: No submental, submandibular, tonsillar, preauricular or posterior auricular adenopathy.     Left side of head: No submental, submandibular, tonsillar, preauricular or posterior auricular adenopathy.     Cervical: No cervical adenopathy.     Upper Body:     Right  upper body: No supraclavicular, axillary or pectoral adenopathy.     Left upper body: No supraclavicular, axillary or pectoral adenopathy.  Skin:    General: Skin is warm and dry.     Capillary Refill: Capillary refill takes less than 2 seconds.     Findings: No rash.  Neurological:     Mental Status: She is alert and oriented to person, place, and time.     Gait: Gait is intact.  Psychiatric:        Attention and Perception: Attention normal.        Mood and Affect: Mood normal.        Speech: Speech normal.        Behavior: Behavior normal. Behavior is cooperative.        Thought Content: Thought content normal.        Judgment: Judgment normal.     Results for orders placed or performed during the hospital encounter of 05/15/23  Prepare RBC (crossmatch)   Collection Time: 05/14/23  8:00 AM  Result Value Ref Range   Order Confirmation      ORDER PROCESSED BY BLOOD BANK Performed at [facility], [address], [place], [place] [address]   Surgical pathology   Collection Time: 05/15/23 12:00 AM  Result Value Ref Range   SURGICAL PATHOLOGY  SURGICAL PATHOLOGY [facility] [address] [place], [place] [address] Telephone [phone] or [phone] Fax [phone]  REPORT OF SURGICAL PATHOLOGY   Accession #: [id number] Patient Name: LOREY, PALLETT Visit # : [id number]  MRN: [id number] Physician: Leonce Senior DOB/Age [birth year] (Age: 13) Gender: F Collected Date: 05/15/2023 Received Date: 05/15/2023  FINAL DIAGNOSIS       1. Uterus, cervix and bilateral fallopian tubes,  :       -  SEROSA:  UNREMARKABLE.  NEGATIVE FOR NEOPLASM.      -  ENDOMETRIUM: PROLIFERATIVE ENDOMETRIUM.  NEGATIVE FOR ATYPICAL      HYPERPLASIA/EIN OR MALIGNANCY.      -  MYOMETRIUM:  UTERINE LEIOMYOMAS, MULTIPLE, INTRAMURAL. NEGATIVE FOR      MALIGNANCY.      -  CERVIX:  FOLLICULAR CERVICITIS. NEGATIVE FOR ATYPIA, DYSPLASIA OR  MALIGNANCY.      -  RIGHT FALLOPIAN TUBE:  PREVIOUSLY LIGATED, FIMBRIATED FALLOPIAN TUBE WITH      MULTIPLE PARATUBAL CYSTS, AND HYDROSALPINX.  NEGATIVE FOR DY SPLASIA OR      MALIGNANCY.      -  LEFT FALLOPIAN TUBE, PREVIOUSLY LIGATED, FIMBRIATED FALLOPIAN TUBE WITH      MULTIPLE PARATUBAL CYSTS AND HYDROSALPINX.  NEGATIVE FOR DYSPLASIA OR      MALIGNANCY.       DATE SIGNED OUT: 05/18/2023 ELECTRONIC SIGNATURE : Jordan Md, Mark, Pathologist, Electronic Signature  MICROSCOPIC DESCRIPTION  CASE COMMENTS STAINS USED IN DIAGNOSIS: H&E H&E H&E H&E H&E H&E H&E H&E    CLINICAL HISTORY  SPECIMEN(S) OBTAINED 1. Uterus, cervix and bilateral fallopian tubes,  SPECIMEN COMMENTS: SPECIMEN CLINICAL INFORMATION: 1. Menorrhagia with regular cycles, fibroids anemia    Gross Description 1. Specimen: Hysterectomy with bilateral salpingectomy      Weight/integrity: 310.7 g (fresh); intact      Measurements:  12.8 cm superior-inferior; 9.7 cm cornu-cornu; 6.8 cm      anterior-posterior; regular and symmetric shape      Cervix: 4.5 x 3.4 cm; 1.9 cm slit like os      Ectocervix: White-tan, mildly wrinkled, and glisteni ng without discrete lesions.      Endocervix: There are two cysts (0.3 cm and 0.9 cm in greatest dimension) with      mood walls containing gelatinous, cloudy material. The remaining endocervix is      white-pink and mildly trabeculated.      Serosa:  pink-tan, smooth, and glistening without discrete fibrous adhesions      Endometrium: 5.0 cm superior-inferior, 3.7 cm cornu-cornu, up to 0.2 cm deep;      red-tan and mildly roughened without discrete lesions.      Myometrium: Up to 3.4 cm thick; multiple tan-white, well circumscribed,      intramural nodules (0.5-3.8 cm in greatest dimension) with solid, whorled, and      bulging cut surfaces. The remaining cut surfaces are pink-tan and mildly      trabeculated.      Right adnexa:      Fallopian tube: Previously  ligated; 7.0 cm long (aggregate), up to 1.4 cm      diameter; purple-pink, tortuous, fimbriated fallopian tube with multiple      paratubal cysts (0.1-0.2 cm in greatest dimension). The proximal portion has a       dilated lumen that contains thin, hemorrhagic fluid; the distal portion is      grossly unremarkable. Discrete lesions are absent.      Ovary: Surgically  absent.      Left adnexa:      Fallopian tube: Previously ligated; 6.1 cm long (in aggregate), up to 1.5 cm in      diameter; purple-pink, tortuous, fimbriated fallopian tube with multiple cysts      fimbria (0.2-0.4 cm in greatest dimension). The proximal portion has a dilated      lumen that contains thin, hemorrhagic fluid; the distal portion is grossly      unremarkable. Discrete lesions are absent.      Ovary: Surgically absent.      Block summary:      1A: Anterior cervix      1B: Posterior cervix      1C: Anterior endomyometrium, full-thickness (bisected)      1D: Posterior endomyometrium, full-thickness (bisected)      1E-F: Nodules, representative      1G: Right fallopian tube (representative) and fimbria (entire)      1H: Left fallopian tube (representative) and fimbria (entire)      AMG 05/15/2023         Report signed out from the following location(s) [place] [address] [place], [place] [address] CLIA #: [id number]  [facility] [address] [place], [place] [address] CLIA #: [id number]   Pregnancy, urine POC   Collection Time: 05/15/23  6:27 AM  Result Value Ref Range   Preg Test, Ur NEGATIVE NEGATIVE  CBC   Collection Time: 05/15/23  6:38 AM  Result Value Ref Range   WBC 6.1 4.0 - 10.5 K/uL   RBC 4.30 3.87 - 5.11 MIL/uL   Hemoglobin 9.6 (L) 12.0 - 15.0 g/dL   HCT 68.7 (L) 63.9 - 53.9 %   MCV 72.6 (L) 80.0 - 100.0 fL   MCH 22.3 (L) 26.0 - 34.0 pg   MCHC 30.8 30.0 - 36.0 g/dL   RDW 75.6 (H) 88.4 - 84.4 %   Platelets 842 (H) 150 - 400 K/uL   nRBC  0.0 0.0 - 0.2 %      Assessment & Plan:   Problem List Items Addressed This Visit   None Visit Diagnoses       Annual physical exam    -  Primary   Health maintenance reviewed during visit today.  Labs ordered.  Vaccines reviewed.  No longer needs a PAP due to hysterectomy.   Relevant Orders   CBC with Differential/Platelet   Comprehensive metabolic panel with GFR   Lipid panel   TSH   Cytology - PAP     Screening for ischemic heart disease       Relevant Orders   Lipid panel     Screening examination for STI       Relevant Orders   Chlamydia/Gonococcus/Trichomonas, NAA([facility])        Follow up plan: Return in about 1 year (around 06/03/2025) for Physical and Fasting labs.   LABORATORY TESTING:  - Pap smear: not applicable  IMMUNIZATIONS:   - Tdap: Tetanus vaccination status reviewed: last tetanus booster within 10 years. - Influenza: Up to date - Pneumovax: Not applicable - Prevnar: Not applicable - COVID: Up to date - HPV: Not applicable - Shingrix vaccine: Not applicable  SCREENING: -Mammogram: Not applicable  - Colonoscopy: Not applicable  - Bone Density: Not applicable  -Hearing Test: Not applicable  -Spirometry: Not applicable   PATIENT COUNSELING:   Advised to take 1 mg of folate supplement per day if capable of pregnancy.   Sexuality: Discussed  sexually transmitted diseases, partner selection, use of condoms, avoidance of unintended pregnancy  and contraceptive alternatives.   Advised to avoid cigarette smoking.  I discussed with the patient that most people either abstain from alcohol or drink within safe limits (<=14/week and <=4 drinks/occasion for males, <=7/weeks and <= 3 drinks/occasion for females) and that the risk for alcohol disorders and other health effects rises proportionally with the number of drinks per week and how often a drinker exceeds daily limits.  Discussed cessation/primary prevention of drug use and availability of treatment  for abuse.   Diet: Encouraged to adjust caloric intake to maintain  or achieve ideal body weight, to reduce intake of dietary saturated fat and total fat, to limit sodium intake by avoiding high sodium foods and not adding table salt, and to maintain adequate dietary potassium and calcium preferably from fresh fruits, vegetables, and low-fat dairy products.    stressed the importance of regular exercise  Injury prevention: Discussed safety belts, safety helmets, smoke detector, smoking near bedding or upholstery.   Dental health: Discussed importance of regular tooth brushing, flossing, and dental visits.    NEXT PREVENTATIVE PHYSICAL DUE IN 1 YEAR. Return in about 1 year (around 06/03/2025) for Physical and Fasting labs.             [1]  Allergies Allergen Reactions   Flagyl  [Metronidazole ] Nausea And Vomiting  [2]  Social History Tobacco Use  Smoking Status Never  Smokeless Tobacco Never   "

## 2024-06-04 LAB — COMPREHENSIVE METABOLIC PANEL WITH GFR
ALT: 21 IU/L (ref 0–32)
AST: 20 IU/L (ref 0–40)
Albumin: 4 g/dL (ref 3.9–4.9)
Alkaline Phosphatase: 50 IU/L (ref 41–116)
BUN/Creatinine Ratio: 15 (ref 9–23)
BUN: 9 mg/dL (ref 6–20)
Bilirubin Total: 0.5 mg/dL (ref 0.0–1.2)
CO2: 22 mmol/L (ref 20–29)
Calcium: 8.9 mg/dL (ref 8.7–10.2)
Chloride: 106 mmol/L (ref 96–106)
Creatinine, Ser: 0.61 mg/dL (ref 0.57–1.00)
Globulin, Total: 3 g/dL (ref 1.5–4.5)
Glucose: 82 mg/dL (ref 70–99)
Potassium: 4.4 mmol/L (ref 3.5–5.2)
Sodium: 140 mmol/L (ref 134–144)
Total Protein: 7 g/dL (ref 6.0–8.5)
eGFR: 118 mL/min/1.73

## 2024-06-04 LAB — CBC WITH DIFFERENTIAL/PLATELET
Basophils Absolute: 0.1 x10E3/uL (ref 0.0–0.2)
Basos: 1 %
EOS (ABSOLUTE): 0.2 x10E3/uL (ref 0.0–0.4)
Eos: 2 %
Hematocrit: 40.2 % (ref 34.0–46.6)
Hemoglobin: 13.3 g/dL (ref 11.1–15.9)
Immature Grans (Abs): 0 x10E3/uL (ref 0.0–0.1)
Immature Granulocytes: 0 %
Lymphocytes Absolute: 2.3 x10E3/uL (ref 0.7–3.1)
Lymphs: 35 %
MCH: 31.8 pg (ref 26.6–33.0)
MCHC: 33.1 g/dL (ref 31.5–35.7)
MCV: 96 fL (ref 79–97)
Monocytes Absolute: 0.4 x10E3/uL (ref 0.1–0.9)
Monocytes: 6 %
Neutrophils Absolute: 3.5 x10E3/uL (ref 1.4–7.0)
Neutrophils: 56 %
Platelets: 249 x10E3/uL (ref 150–450)
RBC: 4.18 x10E6/uL (ref 3.77–5.28)
RDW: 12 % (ref 11.7–15.4)
WBC: 6.4 x10E3/uL (ref 3.4–10.8)

## 2024-06-04 LAB — LIPID PANEL
Chol/HDL Ratio: 2.8 ratio (ref 0.0–4.4)
Cholesterol, Total: 164 mg/dL (ref 100–199)
HDL: 59 mg/dL
LDL Chol Calc (NIH): 92 mg/dL (ref 0–99)
Triglycerides: 69 mg/dL (ref 0–149)
VLDL Cholesterol Cal: 13 mg/dL (ref 5–40)

## 2024-06-04 LAB — TSH: TSH: 0.932 u[IU]/mL (ref 0.450–4.500)

## 2024-06-06 ENCOUNTER — Ambulatory Visit: Payer: Self-pay | Admitting: Nurse Practitioner

## 2024-06-06 LAB — CHLAMYDIA/GONOCOCCUS/TRICHOMONAS, NAA
Chlamydia by NAA: NEGATIVE
Gonococcus by NAA: NEGATIVE
Trich vag by NAA: NEGATIVE

## 2025-06-06 ENCOUNTER — Encounter: Admitting: Nurse Practitioner
